# Patient Record
Sex: Female | Born: 1950 | Race: White | Hispanic: No | Marital: Married | Smoking: Never smoker
Health system: Southern US, Community
[De-identification: ages and names within clinical notes are randomized; demographics above are authoritative.]

## PROBLEM LIST (undated history)

## (undated) DIAGNOSIS — E119 Type 2 diabetes mellitus without complications: Secondary | ICD-10-CM

## (undated) DIAGNOSIS — C801 Malignant (primary) neoplasm, unspecified: Secondary | ICD-10-CM

## (undated) DIAGNOSIS — K219 Gastro-esophageal reflux disease without esophagitis: Secondary | ICD-10-CM

## (undated) DIAGNOSIS — I10 Essential (primary) hypertension: Secondary | ICD-10-CM

## (undated) DIAGNOSIS — R569 Unspecified convulsions: Secondary | ICD-10-CM

## (undated) DIAGNOSIS — Z8719 Personal history of other diseases of the digestive system: Secondary | ICD-10-CM

## (undated) HISTORY — PX: APPENDECTOMY: SHX54

## (undated) HISTORY — PX: LAPAROSCOPIC BILATERAL SALPINGO OOPHERECTOMY: SHX5890

## (undated) HISTORY — PX: BREAST BIOPSY: SHX20

---

## 2002-06-19 ENCOUNTER — Ambulatory Visit (HOSPITAL_COMMUNITY): Admission: RE | Admit: 2002-06-19 | Discharge: 2002-06-19 | Payer: Self-pay | Admitting: Internal Medicine

## 2003-08-06 ENCOUNTER — Ambulatory Visit (HOSPITAL_COMMUNITY): Admission: RE | Admit: 2003-08-06 | Discharge: 2003-08-06 | Payer: Self-pay | Admitting: Internal Medicine

## 2003-08-18 DIAGNOSIS — C801 Malignant (primary) neoplasm, unspecified: Secondary | ICD-10-CM

## 2003-08-18 HISTORY — DX: Malignant (primary) neoplasm, unspecified: C80.1

## 2003-08-18 HISTORY — PX: PARTIAL COLECTOMY: SHX5273

## 2004-12-03 ENCOUNTER — Ambulatory Visit (HOSPITAL_COMMUNITY): Admission: RE | Admit: 2004-12-03 | Discharge: 2004-12-03 | Payer: Self-pay | Admitting: Internal Medicine

## 2004-12-03 ENCOUNTER — Ambulatory Visit: Payer: Self-pay | Admitting: Internal Medicine

## 2010-01-02 ENCOUNTER — Other Ambulatory Visit: Admission: RE | Admit: 2010-01-02 | Discharge: 2010-01-02 | Payer: Self-pay | Admitting: Obstetrics and Gynecology

## 2010-07-16 ENCOUNTER — Ambulatory Visit: Payer: Self-pay | Admitting: Internal Medicine

## 2011-01-02 NOTE — H&P (Signed)
Sheri [name], Sheri [name]                         ACCOUNT NO.:  [id number]   MEDICAL RECORD NO.:  [id number]                  PATIENT TYPE:   LOCATION:                                       FACILITY:   PHYSICIAN:  Lionel December, M.D.                 DATE OF BIRTH:  10/22/1950   DATE OF ADMISSION:  06/07/2002  DATE OF DISCHARGE:                                HISTORY & PHYSICAL   CHIEF COMPLAINT:  1. Constant cough.  2. Hiatal hernia.   HISTORY OF PRESENT ILLNESS:  The patient is a pleasant 60 year old lady, a  patient of Fara Chute, M.D., who presents today as a self-referral for  further evaluation of constant chronic cough and a history of hiatal hernia.  She says that she has had chronic gastrointestinal reflux disease for more  than 10 years.  This has also been associated with typical reflux symptoms,  as well as intermittent coughing.  She also has a history of allergies.  She  has been seen by an allergist in the past, who felt that her symptoms were  secondary to a combination of the acid reflux and allergies.  She has been  on allergy shots for over 10 years, but has not been on any for the last  five years.  She is maintained on Zyrtec.  She tells me that in the past she  has been on Propulsid, which has been the only thing that helped with her  cough.  Currently she is on Nexium 40 mg b.i.d.  She has no heartburn  symptoms, but continues to have chronic intermittent coughing.  She  describes this as a dry, hacking-type cough with a tickle in the back of her  throat.  The symptoms generally are worse when she is lying down at night or  even in the early morning hours.  She really denies any dysphagia, except  for if she eats cornbread.  Denies any odynophagia, nausea, vomiting,  abdominal pain, constipation, diarrhea, melena, or rectal bleeding.  She  never had a colonoscopy.  She never had an upper endoscopy.  She did have an  upper GI series about five years ago, which  revealed the hiatal hernia.  She  was recently started on Zocor for her hypercholesterolemia.  She notes that  she has slightly elevated transaminases, which Fara Chute, M.D., is  following closely.   CURRENT MEDICATIONS:  1. Nexium 40 mg b.i.d.  2. Cozaar 50 mg q.d.  3. Zyrtec 10 mg q.d.  4. Zocor 10 mg q.d.   ALLERGIES:  PHENOBARBITAL caused seizures.  DONNATAL causes hives.   PAST MEDICAL HISTORY:  1. Noninsulin-dependent diabetes mellitus, controlled by diet.  2. Hypertension.  3. Hypercholesterolemia.  4. Seasonal allergies.  5. Recent urinary tract infection, status post treatment.   PAST SURGICAL HISTORY:  Left breast cyst removal, which was benign.   FAMILY HISTORY:  No family history of  chronic GI illnesses, colorectal  cancer, or liver disease.   SOCIAL HISTORY:  She has been married for 33 years.  She has one daughter.  She is currently a housewife.  She has never been a smoker.  Denies any  alcohol use.   REVIEW OF SYMPTOMS:  GASTROINTESTINAL:  Please see the HPI.  GENERAL:  Denies any weight loss.  CARDIOPULMONARY:  Denies any shortness of breath or  chest pain.  Complains of chronic intermittent coughing.  GENITOURINARY:  Denies any dysuria, urinary frequency, or hematuria.   PHYSICAL EXAMINATION:  WEIGHT:  316 pounds.  HEIGHT:  5 feet 1 inch.  VITAL SIGNS:  Temperature 98.2 degrees, blood pressure 142/80, pulse 72.  GENERAL APPEARANCE:  A very pleasant, well-nourished, well-developed,  moderately obese, Caucasian female in no acute distress.  SKIN:  Warm and dry.  No jaundice.  HEENT:  Pupils equal, round, and reactive to light.  The conjunctivae are  pink.  Sclerae nonicteric.  Oropharyngeal mucosa moist and pink with no  lesions, erythema, or exudate.  NECK:  No lymphadenopathy, thyromegaly, or carotid bruits.  CHEST:  Lungs clear to auscultation.  CARDIAC:  Regular rate and rhythm.  Normal S1 and S2.  No murmur, rub, or  gallop.  ABDOMEN:  Positive  bowel sounds.  Obese, but symmetrical.  Soft.  Nontender.  No organomegaly or masses.  EXTREMITIES:  No edema.   IMPRESSION:  The patient is a pleasant 60 year old lady who has more than a  10-year history of chronic gastrointestinal reflux disease.  Typical reflux  symptoms are well controlled on Nexium 40 mg b.i.d.  She also has a chronic  intermittent cough, which has been felt previously to be secondary to a  combination of her allergies and acid reflux.  Notably, she has had complete  resolution of her cough previously on Propulsid.  I suspect that she  probably does have a chronic cough due to her acid reflux based on these  findings.  We talked today about upper endoscopy for surveillance purposes,  as well as diagnostic purposes.  We need to make sure that she does not have  complicated GERD, i.e. Barrett's esophagus.  She has never had a colonoscopy  and I have recommended that she have one in the near future.  She will let  me know when she is ready.   She is noted to have mild transaminitis with an SGOT of 43 and an SGPT of 56  on blood work in July of 2003.  She was placed on Zocor prior to this blood  work.  She reports that Fara Chute, M.D., is following this closely.   PLAN:  1. EGD in the near future.  2. She will continue Nexium 40 mg b.i.d. for now.  3. After EGD, could consider a trial of either domperidone or even Zelnorm     for promobility-type effect.  4.     Further recommendations to follow.  5. She is to continuing following up with Fara Chute, M.D., regarding mild     elevated transaminases.  6. She will let us know when she is ready to schedule colonoscopy.     Tana Coast, P.A.                        Lionel December, M.D.    LL/MEDQ  D:  06/07/2002  T:  06/07/2002  Job:  [id number]   cc:   Fara Chute  [address] [facility]  [place]  [place] [address]  Fax: [phone]

## 2011-01-02 NOTE — Op Note (Signed)
[name], SOBH                       ACCOUNT NO.:  [id number]   MEDICAL RECORD NO.:  [id number]                   PATIENT TYPE:  AMB   LOCATION:  DAY                                  FACILITY:  [facility]   PHYSICIAN:  Sheri [name], M.D.                 DATE OF BIRTH:  Jul 14, 1951   DATE OF PROCEDURE:  08/06/2003  DATE OF DISCHARGE:                                 OPERATIVE REPORT   PROCEDURE:  Total colonoscopy.   INDICATIONS FOR PROCEDURE:  Sheri [name] is a 60 year old Caucasian female who  has been followed by [place] for atypical symptoms of GERD.  She was doing great  on domperidone until it was not available any more.  She was tried on  Zelnorm but developed diarrhea and had some hematochezia.  We did  Hemoccults, and three out of three are position.  She is therefore  undergoing diagnostic colonoscopy.  Family history is negative for  colorectal carcinoma.  The procedure and risks were reviewed with the  patient, and informed consent was obtained.   PREOPERATIVE MEDICATIONS:  Demerol 50 mg IV, Versed 5 mg IV in divided dose.   FINDINGS:  The procedure was performed in the endoscopy suite.  The  patient's vital signs and O2 saturations were monitored during the procedure  and remained stable.  The patient was placed in the left lateral recumbent  position and rectal examination performed.  Soft sentinel skin tags were  noted externally, but digital exam was normal.  The scope was placed into  the rectum and advanced into the region of the sigmoid colon and beyond.  The preparation was excellent.  The scope was passed to the cecum which was  identified by the appendiceal orifice and ileocecal valve.  As the scope was  withdrawn, the colonic mucosa was carefully examined.  There was a small  polyp at the descending colon which was ablated by cold biopsy.  There was a  flat lesion with depressed ulcerated center at rectosigmoid junction about 3  cm in diameter.  Endoscopic appearance  was suspicious for carcinoma.  Pictures were taken followed by multiple biopsies.  The rectal mucosa was  normal.  The scope was retroflexed to examine the anorectal junction, and  small hemorrhoids were noted below the dentate line.  The endoscope was  straightened and withdrawn.  The patient tolerated the procedure well.   FINAL DIAGNOSES:  1. Examination performed to the cecum.  2. Flat ulcerated lesion at rectosigmoid junction about 3 cm in diameter.     Endoscopic appearance is suspicious for carcinoma.  Biopsies were taken.  3. Small polyp ablated by cold biopsy from descending colon.  4. Small external hemorrhoids.   RECOMMENDATIONS:  Will arrange for a surgical consult with Dr. Cleotis Nipper as  soon as biopsy results are available.  I did review the endoscopic findings  over the phone with Dr. Fara Chute who is the patient's  primary care  physician.      ___________________________________________                                            Sheri [name], M.D.   NR/MEDQ  D:  08/06/2003  T:  08/06/2003  Job:  [id number]   cc:   Fara Chute  [address] [place]  [place] [address]  Fax: [phone]   Kathaleen Maser. Cleotis Nipper, M.D.  [address]  [place]  [place] [address]  Fax: [phone]

## 2011-01-02 NOTE — Op Note (Signed)
NAMEMALVA, DIESING             ACCOUNT NO.:  [id number]   MEDICAL RECORD NO.:  [id number]          PATIENT TYPE:  AMB   LOCATION:  DAY                           FACILITY:  [facility]   PHYSICIAN:  Lionel December, M.D.    DATE OF BIRTH:  27-Jul-1951   DATE OF PROCEDURE:  12/03/2004  DATE OF DISCHARGE:                                 OPERATIVE REPORT   PROCEDURE:  Colonoscopy.   INDICATIONS:  Sheri [name] is a 60 year old Caucasian female who is here for  surveillance colonoscopy. 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 She did not require adjuvant therapy. She presently is free  of any symptoms. Family history is negative for colorectal carcinoma.  Procedure risks were reviewed with the patient, and informed consent was  obtained.   PREMEDICATION:  Demerol 50 mg IV Versed 4 mg IV.   FINDINGS:  Procedure performed in endoscopy suite. The patient's vital signs  and O2 saturation were monitored during the procedure and remained stable.  The patient was placed in left lateral position and rectal examination  performed. No abnormality noted on external or digital exam. Olympus  videoscope was placed in rectum and advanced proximally. Colorectal  anastomosis was noted at 15 cm from the anal margin and appeared to be  unremarkable. Scope was passed to proximal sigmoid colon and beyond.  Preparation was excellent. Scope was easily advanced to cecum which was  identified by ileocecal valve and appendiceal orifice. She had some stool  and bile in this area. However, examination was felt to be satisfactory. As  the scope was withdrawn, colonic mucosa was examined for the second time,  and there were no polyps and/or tumor masses. Rectal mucosa similarly was  normal. Scope was retroflexed to examine anorectal junction and was  unremarkable. Endoscope was straightened and withdrawn. The patient  tolerated the procedure well.   FINAL DIAGNOSIS:  1.  Normal colonoscopy.  2.   Patent colorectal anastomosis at 15 cm from the anal margin.   RECOMMENDATIONS:  1.  She will continue yearly Hemoccults.  2.  CEA will be checked today as she has not had one recently.  3.  She should consider next exam in three years from now.      NR/MEDQ  D:  12/03/2004  T:  12/03/2004  Job:  [id number]   cc:   Fara Chute  [address] [place]  [place] [address]  Fax: [phone]   Kathaleen Maser. Cleotis Nipper, M.D.  [address]  [place]  [place] [address]  Fax: [phone]

## 2011-01-02 NOTE — Op Note (Signed)
Sheri [name], Sheri [name]                         ACCOUNT NO.:  [id number]   MEDICAL RECORD NO.:  [id number]                  PATIENT TYPE:   LOCATION:                                       FACILITY:   PHYSICIAN:  Lionel December, M.D.                 DATE OF BIRTH:   DATE OF PROCEDURE:  06/19/2002  DATE OF DISCHARGE:                                 OPERATIVE REPORT   PROCEDURE:  Esophagogastroduodenoscopy.   ENDOSCOPIST:  Lionel December, M.D.   INDICATIONS:  This patient is a 60 year old Caucasian female with a several  year history of GERD who is on antireflux measures, Nexium 40 mg b.i.d.  While her heartburn is reasonably well controlled with therapy, she still  has this recurrent dry hacking cough.  She is undergoing diagnostic EGD.  The procedure and risks were reviewed with the patient and informed consent  was obtained.   PREOPERATIVE MEDICATIONS:  Cetacaine spray for pharyngeal topical  anesthesia, Demerol 50 mg IV and Versed 6 mg IV in divided dose.   INSTRUMENT:  Olympus video system.   FINDINGS:  Procedure performed in endoscopy suite.  The patient's vital  signs and O2 saturations were monitored during the procedure and remained  stable.  The patient was placed in the left lateral recumbent position and  endoscope was passed via the oropharynx without any difficulty into the  esophagus.   ESOPHAGUS:  Mucosa of the esophagus was normal throughout.  The  squamocolumnar junction was unremarkable.  The GE junction was patulous and  remained open.   STOMACH:  It was empty and distended very well with insufflation.  The folds  of the proximal stomach were normal.  Examination of the mucosa revealed  linear streaks at the antrum and prepyloric mucosal edema.  The pyloric  channel, however, was patent.  The angularis and fundus were examined by  retroflexing the scope and were normal.   DUODENUM:  Examination of the bulb and second part of the duodenum was  normal.   The endoscope was withdrawn.  The patient tolerated the procedure well.   FINAL DIAGNOSES:  1. Normal examination of the esophagus.  2. Antral gastritis.   RECOMMENDATIONS:  1. She will continue antireflux measures.  2. Will drop her Nexium to 40 mg q.a.m. and start her on Reglan 10 mg before     breakfast, evening meal and at     bedtime.  3. She will also have Helicobacter pylori serology drawn today.  4. Unless she has side effects with Reglan she will return for office visit     1 month from now.                                               Lionel December, M.D.  NR/MEDQ  D:  06/19/2002  T:  06/19/2002  Job:  [id number]   cc:   Fara Chute  [address] [place]  [place] [address]  Fax: [phone]

## 2011-01-14 ENCOUNTER — Ambulatory Visit (HOSPITAL_COMMUNITY)
Admission: RE | Admit: 2011-01-14 | Discharge: 2011-01-14 | Disposition: A | Discharge status: Home / Self Care | Payer: 59 | Source: Ambulatory Visit | Attending: Internal Medicine | Admitting: Internal Medicine

## 2011-01-14 ENCOUNTER — Other Ambulatory Visit (INDEPENDENT_AMBULATORY_CARE_PROVIDER_SITE_OTHER): Payer: Self-pay | Admitting: Internal Medicine

## 2011-01-14 ENCOUNTER — Encounter (HOSPITAL_BASED_OUTPATIENT_CLINIC_OR_DEPARTMENT_OTHER): Payer: 59 | Admitting: Internal Medicine

## 2011-01-14 DIAGNOSIS — E785 Hyperlipidemia, unspecified: Secondary | ICD-10-CM | POA: Insufficient documentation

## 2011-01-14 DIAGNOSIS — Z85038 Personal history of other malignant neoplasm of large intestine: Secondary | ICD-10-CM

## 2011-01-14 DIAGNOSIS — E119 Type 2 diabetes mellitus without complications: Secondary | ICD-10-CM | POA: Insufficient documentation

## 2011-01-14 DIAGNOSIS — Z79899 Other long term (current) drug therapy: Secondary | ICD-10-CM | POA: Insufficient documentation

## 2011-01-14 DIAGNOSIS — I10 Essential (primary) hypertension: Secondary | ICD-10-CM | POA: Insufficient documentation

## 2011-01-14 DIAGNOSIS — D126 Benign neoplasm of colon, unspecified: Secondary | ICD-10-CM | POA: Insufficient documentation

## 2011-01-14 DIAGNOSIS — K644 Residual hemorrhoidal skin tags: Secondary | ICD-10-CM | POA: Insufficient documentation

## 2011-01-14 DIAGNOSIS — Z09 Encounter for follow-up examination after completed treatment for conditions other than malignant neoplasm: Secondary | ICD-10-CM

## 2011-01-14 DIAGNOSIS — Z9049 Acquired absence of other specified parts of digestive tract: Secondary | ICD-10-CM | POA: Insufficient documentation

## 2011-01-14 LAB — GLUCOSE, CAPILLARY: Glucose-Capillary: 191 mg/dL — ABNORMAL HIGH (ref 70–99)

## 2011-02-03 NOTE — Op Note (Signed)
  NAMECAMREN, [name]             ACCOUNT NO.:  [id number]  MEDICAL RECORD NO.:  [id number]           PATIENT TYPE:  O  LOCATION:  DAYP                          FACILITY:  [facility]  PHYSICIAN:  Lionel December, M.D.    DATE OF BIRTH:  23-Aug-1950  DATE OF PROCEDURE: DATE OF DISCHARGE:                              OPERATIVE REPORT   PROCEDURE:  Colonoscopy.  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  She did not require any adjuvant therapy.  Her last colonoscopy was in May 2009. She is doing well and here for surveillance colonoscopy.  Procedures were reviewed with the patient.  Informed consent was obtained.  MEDS FOR CONSCIOUS SEDATION: 1. Demerol 50 mg IV. 2. Versed 6 mg IV.  FINDINGS:  Procedure performed in endoscopy suite.  The patient's vital signs and O2 sat were monitored during the procedure and remained stable.  The patient was placed in left lateral recumbent position. Rectal examination performed.  No abnormality noted on external or digital exam.  Pentax videoscope was placed through rectum and advanced under vision into sigmoid colon and beyond.  Preparation was excellent. Scope was passed into cecum which was identified by appendiceal orifice and ileocecal valve.  Pictures were taken for the record.  As the scope was withdrawn, colonic mucosa was carefully examined.  There was a small polyp at midtransverse colon heading behind the fold.  This was ablated via cold biopsy.  There was another 3-4 mm polyp at splenic flexure which was ablated via cold biopsy and submitted in separate container. Mucosa and rest of the colon was normal.  Colorectal anastomosis located at 14-15 cm from the anal margin was wide open.  Rectal mucosa was normal.  Scope was retroflexed to examine anorectal junction and small hemorrhoids noted below the dentate line.  Endoscope was withdrawn. Withdrawal  time was 9 minutes and 45 seconds.  The patient tolerated the procedure well.  FINAL DIAGNOSES: 1. Examination performed to cecum. 2. Two small polyps ablated via cold biopsy; one from transverse     colon, second one from the splenic flexure. 3. Patent colorectal anastomosis located at 14-15 cm from the anal     margin. 4. Small external hemorrhoids.  RECOMMENDATIONS:  Standard instructions given.  I will be contacting patient with results of biopsy and further recommendations.          ______________________________ Lionel December, M.D.     NR/MEDQ  D:  01/14/2011  T:  01/14/2011  Job:  [id number]  cc:   Fara Chute, MD Fax: [phone]  Electronically Signed by Lionel December M.D. on 02/03/2011 09:59:48 AM

## 2011-04-27 ENCOUNTER — Telehealth (INDEPENDENT_AMBULATORY_CARE_PROVIDER_SITE_OTHER): Payer: Self-pay | Admitting: Internal Medicine

## 2011-04-27 MED ORDER — RABEPRAZOLE SODIUM 20 MG PO TBEC: 20.0000 mg | DELAYED_RELEASE_TABLET | Freq: Every day | ORAL | Status: DC

## 2011-04-27 NOTE — Telephone Encounter (Signed)
Sheri [name] would like to try another PPI.  The Dexilant has a $90.00 co-pay. Will try her on Aciphex.  Samples for 1 month.   Sheri [name]

## 2014-01-17 DIAGNOSIS — I10 Essential (primary) hypertension: Secondary | ICD-10-CM | POA: Diagnosis not present

## 2014-01-17 DIAGNOSIS — N189 Chronic kidney disease, unspecified: Secondary | ICD-10-CM | POA: Diagnosis not present

## 2014-01-17 DIAGNOSIS — E7800 Pure hypercholesterolemia, unspecified: Secondary | ICD-10-CM | POA: Diagnosis not present

## 2014-01-17 DIAGNOSIS — E119 Type 2 diabetes mellitus without complications: Secondary | ICD-10-CM | POA: Diagnosis not present

## 2014-01-18 ENCOUNTER — Encounter (INDEPENDENT_AMBULATORY_CARE_PROVIDER_SITE_OTHER): Payer: Self-pay | Admitting: *Deleted

## 2014-01-24 DIAGNOSIS — N189 Chronic kidney disease, unspecified: Secondary | ICD-10-CM | POA: Diagnosis not present

## 2014-01-24 DIAGNOSIS — IMO0001 Reserved for inherently not codable concepts without codable children: Secondary | ICD-10-CM | POA: Diagnosis not present

## 2014-01-24 DIAGNOSIS — E669 Obesity, unspecified: Secondary | ICD-10-CM | POA: Diagnosis not present

## 2014-01-24 DIAGNOSIS — I10 Essential (primary) hypertension: Secondary | ICD-10-CM | POA: Diagnosis not present

## 2014-01-24 DIAGNOSIS — E1129 Type 2 diabetes mellitus with other diabetic kidney complication: Secondary | ICD-10-CM | POA: Diagnosis not present

## 2014-01-24 DIAGNOSIS — E7800 Pure hypercholesterolemia, unspecified: Secondary | ICD-10-CM | POA: Diagnosis not present

## 2014-01-24 DIAGNOSIS — C189 Malignant neoplasm of colon, unspecified: Secondary | ICD-10-CM | POA: Diagnosis not present

## 2014-03-16 ENCOUNTER — Telehealth (INDEPENDENT_AMBULATORY_CARE_PROVIDER_SITE_OTHER): Payer: Self-pay | Admitting: *Deleted

## 2014-03-16 ENCOUNTER — Other Ambulatory Visit (INDEPENDENT_AMBULATORY_CARE_PROVIDER_SITE_OTHER): Payer: Self-pay | Admitting: *Deleted

## 2014-03-16 DIAGNOSIS — Z1211 Encounter for screening for malignant neoplasm of colon: Secondary | ICD-10-CM

## 2014-03-16 DIAGNOSIS — Z86010 Personal history of colonic polyps: Secondary | ICD-10-CM

## 2014-03-16 NOTE — Telephone Encounter (Signed)
Patient needs trilyte 

## 2014-03-21 MED ORDER — PEG 3350-KCL-NA BICARB-NACL 420 G PO SOLR: 4000.0000 mL | Freq: Once | ORAL | Status: DC

## 2014-04-10 ENCOUNTER — Encounter (INDEPENDENT_AMBULATORY_CARE_PROVIDER_SITE_OTHER): Payer: Self-pay | Admitting: *Deleted

## 2014-04-11 DIAGNOSIS — K2100 Gastro-esophageal reflux disease with esophagitis, without bleeding: Secondary | ICD-10-CM | POA: Diagnosis not present

## 2014-04-13 ENCOUNTER — Telehealth (INDEPENDENT_AMBULATORY_CARE_PROVIDER_SITE_OTHER): Payer: Self-pay | Admitting: *Deleted

## 2014-04-13 NOTE — Telephone Encounter (Signed)
agree

## 2014-04-13 NOTE — Telephone Encounter (Signed)
  Procedure: tcs  Reason/Indication:  Hx polyps  Has patient had this procedure before?  Yes, 2012 -- paper chart  If so, when, by whom and where?    Is there a family history of colon cancer?  no  Who?  What age when diagnosed?    Is patient diabetic?   yes      Does patient have prosthetic heart valve?  no  Do you have a pacemaker?  no  Has patient ever had endocarditis? no  Has patient had joint replacement within last 12 months?  no  Does patient tend to be constipated or take laxatives? occassionally  Is patient on Coumadin, Plavix and/or Aspirin? yes  Medications: asa 81 mg daily, metformin 1000 mg bid, glipizide 2.5 mg daily, losartan/hctz 50/12.5 mg daily, omeprazole 20 mg daily, simvastatin 10 mg daily, calcium, centrum silver  Allergies: phenobarbital  Medication Adjustment: asa 2 days, hold metformin evening before and morning of, hold glipizide morning of  Procedure date & time: 05/10/14 at 1200

## 2014-04-25 ENCOUNTER — Encounter (HOSPITAL_COMMUNITY): Payer: Self-pay | Admitting: Pharmacy Technician

## 2014-04-25 DIAGNOSIS — Z85828 Personal history of other malignant neoplasm of skin: Secondary | ICD-10-CM | POA: Diagnosis not present

## 2014-04-25 DIAGNOSIS — D2330 Other benign neoplasm of skin of unspecified part of face: Secondary | ICD-10-CM | POA: Diagnosis not present

## 2014-04-25 DIAGNOSIS — D485 Neoplasm of uncertain behavior of skin: Secondary | ICD-10-CM | POA: Diagnosis not present

## 2014-04-25 DIAGNOSIS — L570 Actinic keratosis: Secondary | ICD-10-CM | POA: Diagnosis not present

## 2014-05-11 ENCOUNTER — Ambulatory Visit (HOSPITAL_COMMUNITY): Admission: RE | Admit: 2014-05-11 | Payer: Medicare Other | Source: Ambulatory Visit | Admitting: Internal Medicine

## 2014-05-11 ENCOUNTER — Encounter (HOSPITAL_COMMUNITY): Admission: RE | Payer: Self-pay | Source: Ambulatory Visit

## 2014-05-11 SURGERY — COLONOSCOPY
Anesthesia: Moderate Sedation

## 2014-05-17 DIAGNOSIS — E1165 Type 2 diabetes mellitus with hyperglycemia: Secondary | ICD-10-CM | POA: Diagnosis not present

## 2014-05-17 DIAGNOSIS — E780 Pure hypercholesterolemia: Secondary | ICD-10-CM | POA: Diagnosis not present

## 2014-05-17 DIAGNOSIS — I10 Essential (primary) hypertension: Secondary | ICD-10-CM | POA: Diagnosis not present

## 2014-05-24 DIAGNOSIS — E1165 Type 2 diabetes mellitus with hyperglycemia: Secondary | ICD-10-CM | POA: Diagnosis not present

## 2014-05-24 DIAGNOSIS — I10 Essential (primary) hypertension: Secondary | ICD-10-CM | POA: Diagnosis not present

## 2014-05-24 DIAGNOSIS — C189 Malignant neoplasm of colon, unspecified: Secondary | ICD-10-CM | POA: Diagnosis not present

## 2014-05-24 DIAGNOSIS — Z23 Encounter for immunization: Secondary | ICD-10-CM | POA: Diagnosis not present

## 2014-05-24 DIAGNOSIS — E1122 Type 2 diabetes mellitus with diabetic chronic kidney disease: Secondary | ICD-10-CM | POA: Diagnosis not present

## 2014-05-24 DIAGNOSIS — E780 Pure hypercholesterolemia: Secondary | ICD-10-CM | POA: Diagnosis not present

## 2014-05-24 DIAGNOSIS — K210 Gastro-esophageal reflux disease with esophagitis: Secondary | ICD-10-CM | POA: Diagnosis not present

## 2014-05-24 DIAGNOSIS — N189 Chronic kidney disease, unspecified: Secondary | ICD-10-CM | POA: Diagnosis not present

## 2014-06-08 ENCOUNTER — Telehealth (INDEPENDENT_AMBULATORY_CARE_PROVIDER_SITE_OTHER): Payer: Self-pay | Admitting: *Deleted

## 2014-06-08 ENCOUNTER — Other Ambulatory Visit (INDEPENDENT_AMBULATORY_CARE_PROVIDER_SITE_OTHER): Payer: Self-pay | Admitting: *Deleted

## 2014-06-08 ENCOUNTER — Encounter (INDEPENDENT_AMBULATORY_CARE_PROVIDER_SITE_OTHER): Payer: Self-pay | Admitting: *Deleted

## 2014-06-08 DIAGNOSIS — Z86010 Personal history of colonic polyps: Secondary | ICD-10-CM

## 2014-06-08 NOTE — Telephone Encounter (Signed)
agree

## 2014-06-08 NOTE — Telephone Encounter (Signed)
Referring MD/PCP: sasser    Procedure: tcs  Reason/Indication:  Hx polyps  Has patient had this procedure before?  Yes, 2012 -- paper chart  If so, when, by whom and where?    Is there a family history of colon cancer?  no  Who?  What age when diagnosed?    Is patient diabetic?   yes      Does patient have prosthetic heart valve?  no  Do you have a pacemaker?  no  Has patient ever had endocarditis? no  Has patient had joint replacement within last 12 months?  no  Does patient tend to be constipated or take laxatives? no  Is patient on Coumadin, Plavix and/or Aspirin? yes  Medications: see epic  Allergies: see epic  Medication Adjustment: ass 2 days, hold metformin evening before and monring  Procedure date & time: 06/28/14 at 200

## 2014-06-15 ENCOUNTER — Encounter (HOSPITAL_COMMUNITY): Payer: Self-pay | Admitting: Pharmacy Technician

## 2014-06-25 DIAGNOSIS — L309 Dermatitis, unspecified: Secondary | ICD-10-CM | POA: Diagnosis not present

## 2014-06-28 ENCOUNTER — Encounter (HOSPITAL_COMMUNITY)
Admission: RE | Disposition: A | Discharge status: Home / Self Care | Payer: Self-pay | Source: Ambulatory Visit | Attending: Internal Medicine

## 2014-06-28 ENCOUNTER — Encounter (HOSPITAL_COMMUNITY): Payer: Self-pay | Admitting: *Deleted

## 2014-06-28 ENCOUNTER — Ambulatory Visit (HOSPITAL_COMMUNITY)
Admission: RE | Admit: 2014-06-28 | Discharge: 2014-06-28 | Disposition: A | Discharge status: Home / Self Care | Payer: Medicare Other | Source: Ambulatory Visit | Attending: Internal Medicine | Admitting: Internal Medicine

## 2014-06-28 DIAGNOSIS — Z7982 Long term (current) use of aspirin: Secondary | ICD-10-CM | POA: Diagnosis not present

## 2014-06-28 DIAGNOSIS — Z9049 Acquired absence of other specified parts of digestive tract: Secondary | ICD-10-CM | POA: Insufficient documentation

## 2014-06-28 DIAGNOSIS — Z79899 Other long term (current) drug therapy: Secondary | ICD-10-CM | POA: Insufficient documentation

## 2014-06-28 DIAGNOSIS — K219 Gastro-esophageal reflux disease without esophagitis: Secondary | ICD-10-CM | POA: Insufficient documentation

## 2014-06-28 DIAGNOSIS — I10 Essential (primary) hypertension: Secondary | ICD-10-CM | POA: Insufficient documentation

## 2014-06-28 DIAGNOSIS — K644 Residual hemorrhoidal skin tags: Secondary | ICD-10-CM | POA: Diagnosis not present

## 2014-06-28 DIAGNOSIS — K649 Unspecified hemorrhoids: Secondary | ICD-10-CM | POA: Diagnosis not present

## 2014-06-28 DIAGNOSIS — Z86010 Personal history of colonic polyps: Secondary | ICD-10-CM | POA: Insufficient documentation

## 2014-06-28 DIAGNOSIS — E119 Type 2 diabetes mellitus without complications: Secondary | ICD-10-CM | POA: Diagnosis not present

## 2014-06-28 DIAGNOSIS — Z85038 Personal history of other malignant neoplasm of large intestine: Secondary | ICD-10-CM | POA: Diagnosis not present

## 2014-06-28 HISTORY — DX: Unspecified convulsions: R56.9

## 2014-06-28 HISTORY — DX: Personal history of other diseases of the digestive system: Z87.19

## 2014-06-28 HISTORY — PX: COLONOSCOPY: SHX5424

## 2014-06-28 HISTORY — DX: Type 2 diabetes mellitus without complications: E11.9

## 2014-06-28 HISTORY — DX: Essential (primary) hypertension: I10

## 2014-06-28 HISTORY — DX: Malignant (primary) neoplasm, unspecified: C80.1

## 2014-06-28 HISTORY — DX: Gastro-esophageal reflux disease without esophagitis: K21.9

## 2014-06-28 LAB — GLUCOSE, CAPILLARY: GLUCOSE-CAPILLARY: 157 mg/dL — AB (ref 70–99)

## 2014-06-28 SURGERY — COLONOSCOPY
Anesthesia: Moderate Sedation

## 2014-06-28 MED ORDER — MEPERIDINE HCL 50 MG/ML IJ SOLN
INTRAMUSCULAR | Status: AC
  Filled 2014-06-28: qty 1

## 2014-06-28 MED ORDER — MIDAZOLAM HCL 5 MG/5ML IJ SOLN
INTRAMUSCULAR | Status: DC | PRN
  Administered 2014-06-28 (×4): 2 mg via INTRAVENOUS

## 2014-06-28 MED ORDER — SODIUM CHLORIDE 0.9 % IV SOLN
INTRAVENOUS | Status: DC
Start: 2014-06-28 — End: 2014-06-28
  Administered 2014-06-28: 09:00:00 via INTRAVENOUS

## 2014-06-28 MED ORDER — MEPERIDINE HCL 50 MG/ML IJ SOLN
INTRAMUSCULAR | Status: DC | PRN
  Administered 2014-06-28 (×2): 25 mg via INTRAVENOUS

## 2014-06-28 MED ORDER — MIDAZOLAM HCL 5 MG/5ML IJ SOLN
INTRAMUSCULAR | Status: AC
  Filled 2014-06-28: qty 10

## 2014-06-28 NOTE — Discharge Instructions (Signed)
Resume usual medications and diet. No driving for 24 hours. Next colonoscopy in 4 years.   Colonoscopy, Care After These instructions give you information on caring for yourself after your procedure. Your doctor may also give you more specific instructions. Call your doctor if you have any problems or questions after your procedure. HOME CARE  Do not drive for 24 hours.  Do not sign important papers or use machinery for 24 hours.  You may shower.  You may go back to your usual activities, but go slower for the first 24 hours.  Take rest breaks often during the first 24 hours.  Walk around or use warm packs on your belly (abdomen) if you have belly cramping or gas.  Drink enough fluids to keep your pee (urine) clear or pale yellow.  Resume your normal diet. Avoid heavy or fried foods.  Avoid drinking alcohol for 24 hours or as told by your doctor.  Only take medicines as told by your doctor. If a tissue sample (biopsy) was taken during the procedure:   Do not take aspirin or blood thinners for 7 days, or as told by your doctor.  Do not drink alcohol for 7 days, or as told by your doctor.  Eat soft foods for the first 24 hours. GET HELP IF: You still have a small amount of blood in your poop (stool) 2-3 days after the procedure. GET HELP RIGHT AWAY IF:  You have more than a small amount of blood in your poop.  You see clumps of tissue (blood clots) in your poop.  Your belly is puffy (swollen).  You feel sick to your stomach (nauseous) or throw up (vomit).  You have a fever.  You have belly pain that gets worse and medicine does not help. MAKE SURE YOU:  Understand these instructions.  Will watch your condition.  Will get help right away if you are not doing well or get worse. Document Released: 09/05/2010 Document Revised: 08/08/2013 Document Reviewed: 04/10/2013 [facility] Patient Information 2015 [place], [place]. This information is not intended to replace  advice given to you by your health care provider. Make sure you discuss any questions you have with your health care provider.

## 2014-06-28 NOTE — Op Note (Signed)
COLONOSCOPY PROCEDURE REPORT  PATIENT:  Sheri [name]  MR#:  [id number] Birthdate:  1951/04/03, 63 y.o., female Endoscopist:  Dr. Rogene Houston, MD Referred By:  Dr. Manon Hilding, MD Procedure Date: 06/28/2014  Procedure:   Colonoscopy  Indications:  Patient is 63 year old Caucasian female with history of colon carcinoma. She status post low anterior resection in 2005. She also has history of colonic adenomas. Last exam was in May 2012 with removal of 2 tubular adenomas.  Informed Consent:  The procedure and risks were reviewed with the patient and informed consent was obtained.  Medications:  Demerol 50 mg IV Versed 8 mg IV  Description of procedure:  After a digital rectal exam was performed, that colonoscope was advanced from the anus through the rectum and colon to the area of the cecum, ileocecal valve and appendiceal orifice. The cecum was deeply intubated. These structures were well-seen and photographed for the record. From the level of the cecum and ileocecal valve, the scope was slowly and cautiously withdrawn. The mucosal surfaces were carefully surveyed utilizing scope tip to flexion to facilitate fold flattening as needed. The scope was pulled down into the rectum where a thorough exam including retroflexion was performed.  Findings:  . Prep satisfactory. Normal mucosa of cecum, ascending colon, hepatic flexure, transverse colon, splenic flexure, descending and sigmoid colon. Wide-open colorectal anastomosis at 15 cm from the anal margin. Small hemorrhoids below the dentate line and single small anal papilla.   Therapeutic/Diagnostic Maneuvers Performed:   None  Complications:  none  Cecal Withdrawal Time:  9 minutes  Impression:  Examination performed to cecum. Wide-open colorectal anastomosis. Small external hemorrhoids and single anal papilla. No evidence of recurrent polyps.  Recommendations:  Standard instructions given. Next colonoscopy in 5  years.  Terek Bee U  06/28/2014 10:39 AM  CC: Dr. Manon Hilding, MD & Dr. Rayne Du ref. provider found

## 2014-06-28 NOTE — H&P (Signed)
Sheri [name] is an 63 y.o. female.   Chief Complaint:  Patient is here for colonoscopy. HPI:  Patient is 63 year old Caucasian female with history of colon carcinoma status post low anterior resection back in 2005 and also has history of colonic adenomas and is here for surveillance colonoscopy. She denies abdominal pain rectal bleeding anorexia weight loss. She had acute diarrheal illness over a month ago and has fully recovered. Family history is negative for CRC.  Past Medical History  Diagnosis Date  . Hypertension   . Diabetes mellitus without complication   . GERD (gastroesophageal reflux disease)   . History of hiatal hernia   . Seizures     As an infant  . Cancer 2005    Colon Cancer    Past Surgical History  Procedure Laterality Date  . Laparoscopic bilateral salpingo oopherectomy Bilateral   . Appendectomy    . Breast biopsy    . Partial colectomy  2005    Family History  Problem Relation Age of Onset  . Heart attack Father    Social History:  reports that she has never smoked. She has never used smokeless tobacco. She reports that she does not drink alcohol or use illicit drugs.  Allergies:  Allergies  Allergen Reactions  . Phenobarbital Hives    Medications Prior to Admission  Medication Sig Dispense Refill  . aspirin EC 81 MG tablet Take 81 mg by mouth daily.    . Calcium Carb-Cholecalciferol (CALCIUM 500 +D PO) Take 1 tablet by mouth daily.    Marland Kitchen glipiZIDE (GLUCOTROL XL) 2.5 MG 24 hr tablet Take 2.5 mg by mouth daily with breakfast.    . losartan-hydrochlorothiazide (HYZAAR) 50-12.5 MG per tablet Take 1 tablet by mouth daily.    . metFORMIN (GLUCOPHAGE) 1000 MG tablet Take 1,000 mg by mouth 2 (two) times daily with a meal.    . Multiple Vitamins-Minerals (MULTIVITAMINS THER. W/MINERALS) TABS tablet Take 1 tablet by mouth daily.    . pantoprazole (PROTONIX) 40 MG tablet Take 40 mg by mouth daily.    . ranitidine (ZANTAC) 150 MG tablet Take 150 mg by  mouth at bedtime.    . simvastatin (ZOCOR) 10 MG tablet Take 10 mg by mouth at bedtime.       Results for orders placed or performed during the hospital encounter of 06/28/14 (from the past 48 hour(s))  Glucose, capillary     Status: Abnormal   Collection Time: 06/28/14  9:03 AM  Result Value Ref Range   Glucose-Capillary 157 (H) 70 - 99 mg/dL   Comment 1 Documented in Chart    No results found.  ROS  Blood pressure 190/97, pulse 107, temperature 98.5 F (36.9 C), temperature source Oral, resp. rate 18. Physical Exam  Constitutional: She appears well-developed and well-nourished.  HENT:  Mouth/Throat: Oropharynx is clear and moist.  Eyes: Conjunctivae are normal. No scleral icterus.  Neck: No thyromegaly present.  Cardiovascular: Normal rate, regular rhythm and normal heart sounds.   No murmur heard. Respiratory: Effort normal and breath sounds normal.  GI: Soft. She exhibits no distension and no mass. There is no tenderness.  Midline scar  Musculoskeletal: She exhibits no edema.  Lymphadenopathy:    She has no cervical adenopathy.  Neurological: She is alert.  Skin: Skin is warm and dry.     Assessment/Plan History of colon carcinoma and colonic adenomas. Surveillance colonoscopy.  REHMAN,NAJEEB U 06/28/2014, 10:04 AM

## 2014-06-29 ENCOUNTER — Encounter (HOSPITAL_COMMUNITY): Payer: Self-pay | Admitting: Internal Medicine

## 2014-07-03 DIAGNOSIS — L03113 Cellulitis of right upper limb: Secondary | ICD-10-CM | POA: Diagnosis not present

## 2014-07-06 DIAGNOSIS — L03113 Cellulitis of right upper limb: Secondary | ICD-10-CM | POA: Diagnosis not present

## 2014-08-29 DIAGNOSIS — B372 Candidiasis of skin and nail: Secondary | ICD-10-CM | POA: Diagnosis not present

## 2014-08-29 DIAGNOSIS — B354 Tinea corporis: Secondary | ICD-10-CM | POA: Diagnosis not present

## 2014-09-19 DIAGNOSIS — N189 Chronic kidney disease, unspecified: Secondary | ICD-10-CM | POA: Diagnosis not present

## 2014-09-19 DIAGNOSIS — K210 Gastro-esophageal reflux disease with esophagitis: Secondary | ICD-10-CM | POA: Diagnosis not present

## 2014-09-19 DIAGNOSIS — I10 Essential (primary) hypertension: Secondary | ICD-10-CM | POA: Diagnosis not present

## 2014-09-19 DIAGNOSIS — E1165 Type 2 diabetes mellitus with hyperglycemia: Secondary | ICD-10-CM | POA: Diagnosis not present

## 2014-09-19 DIAGNOSIS — E6609 Other obesity due to excess calories: Secondary | ICD-10-CM | POA: Diagnosis not present

## 2014-09-19 DIAGNOSIS — E780 Pure hypercholesterolemia: Secondary | ICD-10-CM | POA: Diagnosis not present

## 2014-09-26 DIAGNOSIS — E1122 Type 2 diabetes mellitus with diabetic chronic kidney disease: Secondary | ICD-10-CM | POA: Diagnosis not present

## 2014-09-26 DIAGNOSIS — E780 Pure hypercholesterolemia: Secondary | ICD-10-CM | POA: Diagnosis not present

## 2014-09-26 DIAGNOSIS — E6609 Other obesity due to excess calories: Secondary | ICD-10-CM | POA: Diagnosis not present

## 2014-09-26 DIAGNOSIS — Z1389 Encounter for screening for other disorder: Secondary | ICD-10-CM | POA: Diagnosis not present

## 2014-09-26 DIAGNOSIS — I10 Essential (primary) hypertension: Secondary | ICD-10-CM | POA: Diagnosis not present

## 2014-09-26 DIAGNOSIS — N189 Chronic kidney disease, unspecified: Secondary | ICD-10-CM | POA: Diagnosis not present

## 2014-09-26 DIAGNOSIS — C189 Malignant neoplasm of colon, unspecified: Secondary | ICD-10-CM | POA: Diagnosis not present

## 2014-09-26 DIAGNOSIS — E1165 Type 2 diabetes mellitus with hyperglycemia: Secondary | ICD-10-CM | POA: Diagnosis not present

## 2014-09-26 DIAGNOSIS — Z23 Encounter for immunization: Secondary | ICD-10-CM | POA: Diagnosis not present

## 2014-09-26 DIAGNOSIS — K210 Gastro-esophageal reflux disease with esophagitis: Secondary | ICD-10-CM | POA: Diagnosis not present

## 2014-10-23 DIAGNOSIS — B373 Candidiasis of vulva and vagina: Secondary | ICD-10-CM | POA: Diagnosis not present

## 2014-10-23 DIAGNOSIS — R300 Dysuria: Secondary | ICD-10-CM | POA: Diagnosis not present

## 2014-10-29 DIAGNOSIS — Z85828 Personal history of other malignant neoplasm of skin: Secondary | ICD-10-CM | POA: Diagnosis not present

## 2014-10-29 DIAGNOSIS — L570 Actinic keratosis: Secondary | ICD-10-CM | POA: Diagnosis not present

## 2015-01-25 DIAGNOSIS — N189 Chronic kidney disease, unspecified: Secondary | ICD-10-CM | POA: Diagnosis not present

## 2015-01-25 DIAGNOSIS — E780 Pure hypercholesterolemia: Secondary | ICD-10-CM | POA: Diagnosis not present

## 2015-01-25 DIAGNOSIS — I10 Essential (primary) hypertension: Secondary | ICD-10-CM | POA: Diagnosis not present

## 2015-01-25 DIAGNOSIS — K210 Gastro-esophageal reflux disease with esophagitis: Secondary | ICD-10-CM | POA: Diagnosis not present

## 2015-01-25 DIAGNOSIS — E1122 Type 2 diabetes mellitus with diabetic chronic kidney disease: Secondary | ICD-10-CM | POA: Diagnosis not present

## 2015-02-01 DIAGNOSIS — N189 Chronic kidney disease, unspecified: Secondary | ICD-10-CM | POA: Diagnosis not present

## 2015-02-01 DIAGNOSIS — E1122 Type 2 diabetes mellitus with diabetic chronic kidney disease: Secondary | ICD-10-CM | POA: Diagnosis not present

## 2015-02-01 DIAGNOSIS — K210 Gastro-esophageal reflux disease with esophagitis: Secondary | ICD-10-CM | POA: Diagnosis not present

## 2015-02-01 DIAGNOSIS — E782 Mixed hyperlipidemia: Secondary | ICD-10-CM | POA: Diagnosis not present

## 2015-02-01 DIAGNOSIS — E6609 Other obesity due to excess calories: Secondary | ICD-10-CM | POA: Diagnosis not present

## 2015-02-01 DIAGNOSIS — I10 Essential (primary) hypertension: Secondary | ICD-10-CM | POA: Diagnosis not present

## 2015-02-01 DIAGNOSIS — C189 Malignant neoplasm of colon, unspecified: Secondary | ICD-10-CM | POA: Diagnosis not present

## 2015-02-01 DIAGNOSIS — E1165 Type 2 diabetes mellitus with hyperglycemia: Secondary | ICD-10-CM | POA: Diagnosis not present

## 2015-02-05 DIAGNOSIS — E1165 Type 2 diabetes mellitus with hyperglycemia: Secondary | ICD-10-CM | POA: Diagnosis not present

## 2015-03-07 DIAGNOSIS — I10 Essential (primary) hypertension: Secondary | ICD-10-CM | POA: Diagnosis not present

## 2015-04-07 DIAGNOSIS — I10 Essential (primary) hypertension: Secondary | ICD-10-CM | POA: Diagnosis not present

## 2015-05-06 DIAGNOSIS — Z85828 Personal history of other malignant neoplasm of skin: Secondary | ICD-10-CM | POA: Diagnosis not present

## 2015-05-06 DIAGNOSIS — L570 Actinic keratosis: Secondary | ICD-10-CM | POA: Diagnosis not present

## 2015-05-06 DIAGNOSIS — L821 Other seborrheic keratosis: Secondary | ICD-10-CM | POA: Diagnosis not present

## 2015-05-06 DIAGNOSIS — L308 Other specified dermatitis: Secondary | ICD-10-CM | POA: Diagnosis not present

## 2015-05-06 DIAGNOSIS — D485 Neoplasm of uncertain behavior of skin: Secondary | ICD-10-CM | POA: Diagnosis not present

## 2015-05-08 DIAGNOSIS — I10 Essential (primary) hypertension: Secondary | ICD-10-CM | POA: Diagnosis not present

## 2015-05-17 DIAGNOSIS — I10 Essential (primary) hypertension: Secondary | ICD-10-CM | POA: Diagnosis not present

## 2015-05-17 DIAGNOSIS — E782 Mixed hyperlipidemia: Secondary | ICD-10-CM | POA: Diagnosis not present

## 2015-05-17 DIAGNOSIS — N189 Chronic kidney disease, unspecified: Secondary | ICD-10-CM | POA: Diagnosis not present

## 2015-05-17 DIAGNOSIS — K210 Gastro-esophageal reflux disease with esophagitis: Secondary | ICD-10-CM | POA: Diagnosis not present

## 2015-05-17 DIAGNOSIS — E1165 Type 2 diabetes mellitus with hyperglycemia: Secondary | ICD-10-CM | POA: Diagnosis not present

## 2015-05-24 DIAGNOSIS — E6609 Other obesity due to excess calories: Secondary | ICD-10-CM | POA: Diagnosis not present

## 2015-05-24 DIAGNOSIS — C189 Malignant neoplasm of colon, unspecified: Secondary | ICD-10-CM | POA: Diagnosis not present

## 2015-05-24 DIAGNOSIS — N189 Chronic kidney disease, unspecified: Secondary | ICD-10-CM | POA: Diagnosis not present

## 2015-05-24 DIAGNOSIS — K210 Gastro-esophageal reflux disease with esophagitis: Secondary | ICD-10-CM | POA: Diagnosis not present

## 2015-05-24 DIAGNOSIS — I10 Essential (primary) hypertension: Secondary | ICD-10-CM | POA: Diagnosis not present

## 2015-05-24 DIAGNOSIS — E1122 Type 2 diabetes mellitus with diabetic chronic kidney disease: Secondary | ICD-10-CM | POA: Diagnosis not present

## 2015-05-24 DIAGNOSIS — E1165 Type 2 diabetes mellitus with hyperglycemia: Secondary | ICD-10-CM | POA: Diagnosis not present

## 2015-05-24 DIAGNOSIS — E782 Mixed hyperlipidemia: Secondary | ICD-10-CM | POA: Diagnosis not present

## 2015-05-24 DIAGNOSIS — Z23 Encounter for immunization: Secondary | ICD-10-CM | POA: Diagnosis not present

## 2015-05-24 DIAGNOSIS — N183 Chronic kidney disease, stage 3 (moderate): Secondary | ICD-10-CM | POA: Diagnosis not present

## 2015-06-07 DIAGNOSIS — K219 Gastro-esophageal reflux disease without esophagitis: Secondary | ICD-10-CM | POA: Diagnosis not present

## 2015-07-08 DIAGNOSIS — E119 Type 2 diabetes mellitus without complications: Secondary | ICD-10-CM | POA: Diagnosis not present

## 2015-07-08 DIAGNOSIS — I10 Essential (primary) hypertension: Secondary | ICD-10-CM | POA: Diagnosis not present

## 2015-07-30 DIAGNOSIS — J0100 Acute maxillary sinusitis, unspecified: Secondary | ICD-10-CM | POA: Diagnosis not present

## 2015-08-07 DIAGNOSIS — I10 Essential (primary) hypertension: Secondary | ICD-10-CM | POA: Diagnosis not present

## 2015-08-07 DIAGNOSIS — S60411A Abrasion of left index finger, initial encounter: Secondary | ICD-10-CM | POA: Diagnosis not present

## 2015-08-08 DIAGNOSIS — S60411D Abrasion of left index finger, subsequent encounter: Secondary | ICD-10-CM | POA: Diagnosis not present

## 2015-09-17 DIAGNOSIS — E1165 Type 2 diabetes mellitus with hyperglycemia: Secondary | ICD-10-CM | POA: Diagnosis not present

## 2015-09-17 DIAGNOSIS — K210 Gastro-esophageal reflux disease with esophagitis: Secondary | ICD-10-CM | POA: Diagnosis not present

## 2015-09-17 DIAGNOSIS — I10 Essential (primary) hypertension: Secondary | ICD-10-CM | POA: Diagnosis not present

## 2015-09-17 DIAGNOSIS — N183 Chronic kidney disease, stage 3 (moderate): Secondary | ICD-10-CM | POA: Diagnosis not present

## 2015-09-17 DIAGNOSIS — E782 Mixed hyperlipidemia: Secondary | ICD-10-CM | POA: Diagnosis not present

## 2015-09-24 DIAGNOSIS — E1165 Type 2 diabetes mellitus with hyperglycemia: Secondary | ICD-10-CM | POA: Diagnosis not present

## 2015-09-24 DIAGNOSIS — C189 Malignant neoplasm of colon, unspecified: Secondary | ICD-10-CM | POA: Diagnosis not present

## 2015-09-24 DIAGNOSIS — N183 Chronic kidney disease, stage 3 (moderate): Secondary | ICD-10-CM | POA: Diagnosis not present

## 2015-09-24 DIAGNOSIS — E1122 Type 2 diabetes mellitus with diabetic chronic kidney disease: Secondary | ICD-10-CM | POA: Diagnosis not present

## 2015-09-24 DIAGNOSIS — N189 Chronic kidney disease, unspecified: Secondary | ICD-10-CM | POA: Diagnosis not present

## 2015-09-24 DIAGNOSIS — E782 Mixed hyperlipidemia: Secondary | ICD-10-CM | POA: Diagnosis not present

## 2015-09-24 DIAGNOSIS — I10 Essential (primary) hypertension: Secondary | ICD-10-CM | POA: Diagnosis not present

## 2015-09-24 DIAGNOSIS — K210 Gastro-esophageal reflux disease with esophagitis: Secondary | ICD-10-CM | POA: Diagnosis not present

## 2015-09-24 DIAGNOSIS — E6609 Other obesity due to excess calories: Secondary | ICD-10-CM | POA: Diagnosis not present

## 2015-11-04 DIAGNOSIS — L570 Actinic keratosis: Secondary | ICD-10-CM | POA: Diagnosis not present

## 2015-11-04 DIAGNOSIS — L300 Nummular dermatitis: Secondary | ICD-10-CM | POA: Diagnosis not present

## 2015-11-04 DIAGNOSIS — Z85828 Personal history of other malignant neoplasm of skin: Secondary | ICD-10-CM | POA: Diagnosis not present

## 2016-01-29 DIAGNOSIS — E782 Mixed hyperlipidemia: Secondary | ICD-10-CM | POA: Diagnosis not present

## 2016-01-29 DIAGNOSIS — E1122 Type 2 diabetes mellitus with diabetic chronic kidney disease: Secondary | ICD-10-CM | POA: Diagnosis not present

## 2016-01-29 DIAGNOSIS — I10 Essential (primary) hypertension: Secondary | ICD-10-CM | POA: Diagnosis not present

## 2016-01-29 DIAGNOSIS — E7800 Pure hypercholesterolemia, unspecified: Secondary | ICD-10-CM | POA: Diagnosis not present

## 2016-01-29 DIAGNOSIS — N183 Chronic kidney disease, stage 3 (moderate): Secondary | ICD-10-CM | POA: Diagnosis not present

## 2016-01-29 DIAGNOSIS — K210 Gastro-esophageal reflux disease with esophagitis: Secondary | ICD-10-CM | POA: Diagnosis not present

## 2016-01-31 DIAGNOSIS — K210 Gastro-esophageal reflux disease with esophagitis: Secondary | ICD-10-CM | POA: Diagnosis not present

## 2016-01-31 DIAGNOSIS — E782 Mixed hyperlipidemia: Secondary | ICD-10-CM | POA: Diagnosis not present

## 2016-01-31 DIAGNOSIS — E1122 Type 2 diabetes mellitus with diabetic chronic kidney disease: Secondary | ICD-10-CM | POA: Diagnosis not present

## 2016-01-31 DIAGNOSIS — E1165 Type 2 diabetes mellitus with hyperglycemia: Secondary | ICD-10-CM | POA: Diagnosis not present

## 2016-01-31 DIAGNOSIS — N189 Chronic kidney disease, unspecified: Secondary | ICD-10-CM | POA: Diagnosis not present

## 2016-01-31 DIAGNOSIS — C189 Malignant neoplasm of colon, unspecified: Secondary | ICD-10-CM | POA: Diagnosis not present

## 2016-01-31 DIAGNOSIS — I10 Essential (primary) hypertension: Secondary | ICD-10-CM | POA: Diagnosis not present

## 2016-01-31 DIAGNOSIS — E6609 Other obesity due to excess calories: Secondary | ICD-10-CM | POA: Diagnosis not present

## 2016-04-09 DIAGNOSIS — I10 Essential (primary) hypertension: Secondary | ICD-10-CM | POA: Diagnosis not present

## 2016-04-09 DIAGNOSIS — B372 Candidiasis of skin and nail: Secondary | ICD-10-CM | POA: Diagnosis not present

## 2016-04-09 DIAGNOSIS — E1165 Type 2 diabetes mellitus with hyperglycemia: Secondary | ICD-10-CM | POA: Diagnosis not present

## 2016-04-09 DIAGNOSIS — Z6835 Body mass index (BMI) 35.0-35.9, adult: Secondary | ICD-10-CM | POA: Diagnosis not present

## 2016-05-05 DIAGNOSIS — Z85828 Personal history of other malignant neoplasm of skin: Secondary | ICD-10-CM | POA: Diagnosis not present

## 2016-05-05 DIAGNOSIS — L309 Dermatitis, unspecified: Secondary | ICD-10-CM | POA: Diagnosis not present

## 2016-05-13 DIAGNOSIS — E119 Type 2 diabetes mellitus without complications: Secondary | ICD-10-CM | POA: Diagnosis not present

## 2016-05-13 DIAGNOSIS — H524 Presbyopia: Secondary | ICD-10-CM | POA: Diagnosis not present

## 2016-05-13 DIAGNOSIS — H2513 Age-related nuclear cataract, bilateral: Secondary | ICD-10-CM | POA: Diagnosis not present

## 2016-05-21 DIAGNOSIS — E1122 Type 2 diabetes mellitus with diabetic chronic kidney disease: Secondary | ICD-10-CM | POA: Diagnosis not present

## 2016-05-21 DIAGNOSIS — I10 Essential (primary) hypertension: Secondary | ICD-10-CM | POA: Diagnosis not present

## 2016-05-21 DIAGNOSIS — E7800 Pure hypercholesterolemia, unspecified: Secondary | ICD-10-CM | POA: Diagnosis not present

## 2016-05-21 DIAGNOSIS — E782 Mixed hyperlipidemia: Secondary | ICD-10-CM | POA: Diagnosis not present

## 2016-05-21 DIAGNOSIS — K210 Gastro-esophageal reflux disease with esophagitis: Secondary | ICD-10-CM | POA: Diagnosis not present

## 2016-05-21 DIAGNOSIS — Z13220 Encounter for screening for lipoid disorders: Secondary | ICD-10-CM | POA: Diagnosis not present

## 2016-05-25 DIAGNOSIS — E1122 Type 2 diabetes mellitus with diabetic chronic kidney disease: Secondary | ICD-10-CM | POA: Diagnosis not present

## 2016-05-25 DIAGNOSIS — K210 Gastro-esophageal reflux disease with esophagitis: Secondary | ICD-10-CM | POA: Diagnosis not present

## 2016-05-25 DIAGNOSIS — Z23 Encounter for immunization: Secondary | ICD-10-CM | POA: Diagnosis not present

## 2016-05-25 DIAGNOSIS — Z6836 Body mass index (BMI) 36.0-36.9, adult: Secondary | ICD-10-CM | POA: Diagnosis not present

## 2016-05-25 DIAGNOSIS — C189 Malignant neoplasm of colon, unspecified: Secondary | ICD-10-CM | POA: Diagnosis not present

## 2016-05-25 DIAGNOSIS — N189 Chronic kidney disease, unspecified: Secondary | ICD-10-CM | POA: Diagnosis not present

## 2016-05-25 DIAGNOSIS — E1165 Type 2 diabetes mellitus with hyperglycemia: Secondary | ICD-10-CM | POA: Diagnosis not present

## 2016-05-25 DIAGNOSIS — I10 Essential (primary) hypertension: Secondary | ICD-10-CM | POA: Diagnosis not present

## 2016-08-07 DIAGNOSIS — B372 Candidiasis of skin and nail: Secondary | ICD-10-CM | POA: Diagnosis not present

## 2016-08-07 DIAGNOSIS — Z6835 Body mass index (BMI) 35.0-35.9, adult: Secondary | ICD-10-CM | POA: Diagnosis not present

## 2016-08-24 DIAGNOSIS — L304 Erythema intertrigo: Secondary | ICD-10-CM | POA: Diagnosis not present

## 2016-09-21 DIAGNOSIS — L570 Actinic keratosis: Secondary | ICD-10-CM | POA: Diagnosis not present

## 2016-09-21 DIAGNOSIS — L820 Inflamed seborrheic keratosis: Secondary | ICD-10-CM | POA: Diagnosis not present

## 2016-09-21 DIAGNOSIS — D2239 Melanocytic nevi of other parts of face: Secondary | ICD-10-CM | POA: Diagnosis not present

## 2016-09-21 DIAGNOSIS — L538 Other specified erythematous conditions: Secondary | ICD-10-CM | POA: Diagnosis not present

## 2016-09-21 DIAGNOSIS — L304 Erythema intertrigo: Secondary | ICD-10-CM | POA: Diagnosis not present

## 2016-09-21 DIAGNOSIS — L300 Nummular dermatitis: Secondary | ICD-10-CM | POA: Diagnosis not present

## 2016-09-24 DIAGNOSIS — E1165 Type 2 diabetes mellitus with hyperglycemia: Secondary | ICD-10-CM | POA: Diagnosis not present

## 2016-09-24 DIAGNOSIS — E782 Mixed hyperlipidemia: Secondary | ICD-10-CM | POA: Diagnosis not present

## 2016-09-24 DIAGNOSIS — E7800 Pure hypercholesterolemia, unspecified: Secondary | ICD-10-CM | POA: Diagnosis not present

## 2016-09-24 DIAGNOSIS — I10 Essential (primary) hypertension: Secondary | ICD-10-CM | POA: Diagnosis not present

## 2016-09-24 DIAGNOSIS — E1122 Type 2 diabetes mellitus with diabetic chronic kidney disease: Secondary | ICD-10-CM | POA: Diagnosis not present

## 2016-09-29 DIAGNOSIS — E782 Mixed hyperlipidemia: Secondary | ICD-10-CM | POA: Diagnosis not present

## 2016-09-29 DIAGNOSIS — E1122 Type 2 diabetes mellitus with diabetic chronic kidney disease: Secondary | ICD-10-CM | POA: Diagnosis not present

## 2016-09-29 DIAGNOSIS — I10 Essential (primary) hypertension: Secondary | ICD-10-CM | POA: Diagnosis not present

## 2016-09-29 DIAGNOSIS — N189 Chronic kidney disease, unspecified: Secondary | ICD-10-CM | POA: Diagnosis not present

## 2016-09-29 DIAGNOSIS — E1165 Type 2 diabetes mellitus with hyperglycemia: Secondary | ICD-10-CM | POA: Diagnosis not present

## 2016-09-29 DIAGNOSIS — K210 Gastro-esophageal reflux disease with esophagitis: Secondary | ICD-10-CM | POA: Diagnosis not present

## 2016-09-29 DIAGNOSIS — C189 Malignant neoplasm of colon, unspecified: Secondary | ICD-10-CM | POA: Diagnosis not present

## 2016-09-29 DIAGNOSIS — E6609 Other obesity due to excess calories: Secondary | ICD-10-CM | POA: Diagnosis not present

## 2016-12-21 DIAGNOSIS — D2239 Melanocytic nevi of other parts of face: Secondary | ICD-10-CM | POA: Diagnosis not present

## 2016-12-21 DIAGNOSIS — Z1283 Encounter for screening for malignant neoplasm of skin: Secondary | ICD-10-CM | POA: Diagnosis not present

## 2016-12-21 DIAGNOSIS — L738 Other specified follicular disorders: Secondary | ICD-10-CM | POA: Diagnosis not present

## 2016-12-21 DIAGNOSIS — L570 Actinic keratosis: Secondary | ICD-10-CM | POA: Diagnosis not present

## 2016-12-21 DIAGNOSIS — Z08 Encounter for follow-up examination after completed treatment for malignant neoplasm: Secondary | ICD-10-CM | POA: Diagnosis not present

## 2016-12-21 DIAGNOSIS — L304 Erythema intertrigo: Secondary | ICD-10-CM | POA: Diagnosis not present

## 2016-12-21 DIAGNOSIS — L821 Other seborrheic keratosis: Secondary | ICD-10-CM | POA: Diagnosis not present

## 2016-12-21 DIAGNOSIS — Z85828 Personal history of other malignant neoplasm of skin: Secondary | ICD-10-CM | POA: Diagnosis not present

## 2016-12-21 DIAGNOSIS — D225 Melanocytic nevi of trunk: Secondary | ICD-10-CM | POA: Diagnosis not present

## 2016-12-21 DIAGNOSIS — L300 Nummular dermatitis: Secondary | ICD-10-CM | POA: Diagnosis not present

## 2017-01-15 DIAGNOSIS — E782 Mixed hyperlipidemia: Secondary | ICD-10-CM | POA: Diagnosis not present

## 2017-01-15 DIAGNOSIS — I10 Essential (primary) hypertension: Secondary | ICD-10-CM | POA: Diagnosis not present

## 2017-01-15 DIAGNOSIS — N183 Chronic kidney disease, stage 3 (moderate): Secondary | ICD-10-CM | POA: Diagnosis not present

## 2017-01-15 DIAGNOSIS — E1165 Type 2 diabetes mellitus with hyperglycemia: Secondary | ICD-10-CM | POA: Diagnosis not present

## 2017-01-15 DIAGNOSIS — E7800 Pure hypercholesterolemia, unspecified: Secondary | ICD-10-CM | POA: Diagnosis not present

## 2017-01-15 DIAGNOSIS — K210 Gastro-esophageal reflux disease with esophagitis: Secondary | ICD-10-CM | POA: Diagnosis not present

## 2017-01-15 DIAGNOSIS — C189 Malignant neoplasm of colon, unspecified: Secondary | ICD-10-CM | POA: Diagnosis not present

## 2017-01-15 DIAGNOSIS — E1122 Type 2 diabetes mellitus with diabetic chronic kidney disease: Secondary | ICD-10-CM | POA: Diagnosis not present

## 2017-01-19 DIAGNOSIS — E1165 Type 2 diabetes mellitus with hyperglycemia: Secondary | ICD-10-CM | POA: Diagnosis not present

## 2017-01-19 DIAGNOSIS — C189 Malignant neoplasm of colon, unspecified: Secondary | ICD-10-CM | POA: Diagnosis not present

## 2017-01-19 DIAGNOSIS — K210 Gastro-esophageal reflux disease with esophagitis: Secondary | ICD-10-CM | POA: Diagnosis not present

## 2017-01-19 DIAGNOSIS — E6609 Other obesity due to excess calories: Secondary | ICD-10-CM | POA: Diagnosis not present

## 2017-01-19 DIAGNOSIS — E1122 Type 2 diabetes mellitus with diabetic chronic kidney disease: Secondary | ICD-10-CM | POA: Diagnosis not present

## 2017-01-19 DIAGNOSIS — S161XXA Strain of muscle, fascia and tendon at neck level, initial encounter: Secondary | ICD-10-CM | POA: Diagnosis not present

## 2017-01-19 DIAGNOSIS — N189 Chronic kidney disease, unspecified: Secondary | ICD-10-CM | POA: Diagnosis not present

## 2017-01-19 DIAGNOSIS — I10 Essential (primary) hypertension: Secondary | ICD-10-CM | POA: Diagnosis not present

## 2017-01-21 DIAGNOSIS — M531 Cervicobrachial syndrome: Secondary | ICD-10-CM | POA: Diagnosis not present

## 2017-01-21 DIAGNOSIS — M47812 Spondylosis without myelopathy or radiculopathy, cervical region: Secondary | ICD-10-CM | POA: Diagnosis not present

## 2017-01-21 DIAGNOSIS — M9901 Segmental and somatic dysfunction of cervical region: Secondary | ICD-10-CM | POA: Diagnosis not present

## 2017-01-22 DIAGNOSIS — M9901 Segmental and somatic dysfunction of cervical region: Secondary | ICD-10-CM | POA: Diagnosis not present

## 2017-01-22 DIAGNOSIS — M47812 Spondylosis without myelopathy or radiculopathy, cervical region: Secondary | ICD-10-CM | POA: Diagnosis not present

## 2017-01-22 DIAGNOSIS — M531 Cervicobrachial syndrome: Secondary | ICD-10-CM | POA: Diagnosis not present

## 2017-05-13 DIAGNOSIS — N183 Chronic kidney disease, stage 3 (moderate): Secondary | ICD-10-CM | POA: Diagnosis not present

## 2017-05-13 DIAGNOSIS — E782 Mixed hyperlipidemia: Secondary | ICD-10-CM | POA: Diagnosis not present

## 2017-05-13 DIAGNOSIS — E1165 Type 2 diabetes mellitus with hyperglycemia: Secondary | ICD-10-CM | POA: Diagnosis not present

## 2017-05-13 DIAGNOSIS — E1122 Type 2 diabetes mellitus with diabetic chronic kidney disease: Secondary | ICD-10-CM | POA: Diagnosis not present

## 2017-05-13 DIAGNOSIS — E7800 Pure hypercholesterolemia, unspecified: Secondary | ICD-10-CM | POA: Diagnosis not present

## 2017-05-13 DIAGNOSIS — K210 Gastro-esophageal reflux disease with esophagitis: Secondary | ICD-10-CM | POA: Diagnosis not present

## 2017-05-13 DIAGNOSIS — I10 Essential (primary) hypertension: Secondary | ICD-10-CM | POA: Diagnosis not present

## 2017-05-18 DIAGNOSIS — C189 Malignant neoplasm of colon, unspecified: Secondary | ICD-10-CM | POA: Diagnosis not present

## 2017-05-18 DIAGNOSIS — E1165 Type 2 diabetes mellitus with hyperglycemia: Secondary | ICD-10-CM | POA: Diagnosis not present

## 2017-05-18 DIAGNOSIS — Z23 Encounter for immunization: Secondary | ICD-10-CM | POA: Diagnosis not present

## 2017-05-18 DIAGNOSIS — Z6834 Body mass index (BMI) 34.0-34.9, adult: Secondary | ICD-10-CM | POA: Diagnosis not present

## 2017-05-18 DIAGNOSIS — I10 Essential (primary) hypertension: Secondary | ICD-10-CM | POA: Diagnosis not present

## 2017-05-18 DIAGNOSIS — K210 Gastro-esophageal reflux disease with esophagitis: Secondary | ICD-10-CM | POA: Diagnosis not present

## 2017-05-18 DIAGNOSIS — E1122 Type 2 diabetes mellitus with diabetic chronic kidney disease: Secondary | ICD-10-CM | POA: Diagnosis not present

## 2017-05-18 DIAGNOSIS — N189 Chronic kidney disease, unspecified: Secondary | ICD-10-CM | POA: Diagnosis not present

## 2017-05-19 DIAGNOSIS — E119 Type 2 diabetes mellitus without complications: Secondary | ICD-10-CM | POA: Diagnosis not present

## 2017-05-19 DIAGNOSIS — H2513 Age-related nuclear cataract, bilateral: Secondary | ICD-10-CM | POA: Diagnosis not present

## 2017-06-01 DIAGNOSIS — Z6834 Body mass index (BMI) 34.0-34.9, adult: Secondary | ICD-10-CM | POA: Diagnosis not present

## 2017-06-01 DIAGNOSIS — J0101 Acute recurrent maxillary sinusitis: Secondary | ICD-10-CM | POA: Diagnosis not present

## 2017-09-20 DIAGNOSIS — E1122 Type 2 diabetes mellitus with diabetic chronic kidney disease: Secondary | ICD-10-CM | POA: Diagnosis not present

## 2017-09-20 DIAGNOSIS — E7800 Pure hypercholesterolemia, unspecified: Secondary | ICD-10-CM | POA: Diagnosis not present

## 2017-09-20 DIAGNOSIS — N183 Chronic kidney disease, stage 3 (moderate): Secondary | ICD-10-CM | POA: Diagnosis not present

## 2017-09-20 DIAGNOSIS — C189 Malignant neoplasm of colon, unspecified: Secondary | ICD-10-CM | POA: Diagnosis not present

## 2017-09-20 DIAGNOSIS — I10 Essential (primary) hypertension: Secondary | ICD-10-CM | POA: Diagnosis not present

## 2017-09-20 DIAGNOSIS — E782 Mixed hyperlipidemia: Secondary | ICD-10-CM | POA: Diagnosis not present

## 2017-09-20 DIAGNOSIS — K210 Gastro-esophageal reflux disease with esophagitis: Secondary | ICD-10-CM | POA: Diagnosis not present

## 2017-09-23 DIAGNOSIS — N189 Chronic kidney disease, unspecified: Secondary | ICD-10-CM | POA: Diagnosis not present

## 2017-09-23 DIAGNOSIS — M722 Plantar fascial fibromatosis: Secondary | ICD-10-CM | POA: Diagnosis not present

## 2017-09-23 DIAGNOSIS — C189 Malignant neoplasm of colon, unspecified: Secondary | ICD-10-CM | POA: Diagnosis not present

## 2017-09-23 DIAGNOSIS — E1165 Type 2 diabetes mellitus with hyperglycemia: Secondary | ICD-10-CM | POA: Diagnosis not present

## 2017-09-23 DIAGNOSIS — I10 Essential (primary) hypertension: Secondary | ICD-10-CM | POA: Diagnosis not present

## 2017-09-23 DIAGNOSIS — E1122 Type 2 diabetes mellitus with diabetic chronic kidney disease: Secondary | ICD-10-CM | POA: Diagnosis not present

## 2017-09-23 DIAGNOSIS — N183 Chronic kidney disease, stage 3 (moderate): Secondary | ICD-10-CM | POA: Diagnosis not present

## 2017-09-23 DIAGNOSIS — E782 Mixed hyperlipidemia: Secondary | ICD-10-CM | POA: Diagnosis not present

## 2017-12-21 DIAGNOSIS — Z85828 Personal history of other malignant neoplasm of skin: Secondary | ICD-10-CM | POA: Diagnosis not present

## 2017-12-21 DIAGNOSIS — L821 Other seborrheic keratosis: Secondary | ICD-10-CM | POA: Diagnosis not present

## 2017-12-21 DIAGNOSIS — D2262 Melanocytic nevi of left upper limb, including shoulder: Secondary | ICD-10-CM | POA: Diagnosis not present

## 2017-12-21 DIAGNOSIS — L304 Erythema intertrigo: Secondary | ICD-10-CM | POA: Diagnosis not present

## 2017-12-21 DIAGNOSIS — D2239 Melanocytic nevi of other parts of face: Secondary | ICD-10-CM | POA: Diagnosis not present

## 2017-12-21 DIAGNOSIS — Z08 Encounter for follow-up examination after completed treatment for malignant neoplasm: Secondary | ICD-10-CM | POA: Diagnosis not present

## 2017-12-21 DIAGNOSIS — L570 Actinic keratosis: Secondary | ICD-10-CM | POA: Diagnosis not present

## 2017-12-21 DIAGNOSIS — Z1283 Encounter for screening for malignant neoplasm of skin: Secondary | ICD-10-CM | POA: Diagnosis not present

## 2017-12-21 DIAGNOSIS — D2261 Melanocytic nevi of right upper limb, including shoulder: Secondary | ICD-10-CM | POA: Diagnosis not present

## 2017-12-30 DIAGNOSIS — M47816 Spondylosis without myelopathy or radiculopathy, lumbar region: Secondary | ICD-10-CM | POA: Diagnosis not present

## 2017-12-30 DIAGNOSIS — M5441 Lumbago with sciatica, right side: Secondary | ICD-10-CM | POA: Diagnosis not present

## 2017-12-30 DIAGNOSIS — M9903 Segmental and somatic dysfunction of lumbar region: Secondary | ICD-10-CM | POA: Diagnosis not present

## 2018-01-03 DIAGNOSIS — M9903 Segmental and somatic dysfunction of lumbar region: Secondary | ICD-10-CM | POA: Diagnosis not present

## 2018-01-03 DIAGNOSIS — M5441 Lumbago with sciatica, right side: Secondary | ICD-10-CM | POA: Diagnosis not present

## 2018-01-03 DIAGNOSIS — M47816 Spondylosis without myelopathy or radiculopathy, lumbar region: Secondary | ICD-10-CM | POA: Diagnosis not present

## 2018-01-05 DIAGNOSIS — M47816 Spondylosis without myelopathy or radiculopathy, lumbar region: Secondary | ICD-10-CM | POA: Diagnosis not present

## 2018-01-05 DIAGNOSIS — M5441 Lumbago with sciatica, right side: Secondary | ICD-10-CM | POA: Diagnosis not present

## 2018-01-05 DIAGNOSIS — M9903 Segmental and somatic dysfunction of lumbar region: Secondary | ICD-10-CM | POA: Diagnosis not present

## 2018-01-07 DIAGNOSIS — M5441 Lumbago with sciatica, right side: Secondary | ICD-10-CM | POA: Diagnosis not present

## 2018-01-07 DIAGNOSIS — M9903 Segmental and somatic dysfunction of lumbar region: Secondary | ICD-10-CM | POA: Diagnosis not present

## 2018-01-07 DIAGNOSIS — M47816 Spondylosis without myelopathy or radiculopathy, lumbar region: Secondary | ICD-10-CM | POA: Diagnosis not present

## 2018-01-12 DIAGNOSIS — M5441 Lumbago with sciatica, right side: Secondary | ICD-10-CM | POA: Diagnosis not present

## 2018-01-12 DIAGNOSIS — M47816 Spondylosis without myelopathy or radiculopathy, lumbar region: Secondary | ICD-10-CM | POA: Diagnosis not present

## 2018-01-12 DIAGNOSIS — M9903 Segmental and somatic dysfunction of lumbar region: Secondary | ICD-10-CM | POA: Diagnosis not present

## 2018-01-14 DIAGNOSIS — M5441 Lumbago with sciatica, right side: Secondary | ICD-10-CM | POA: Diagnosis not present

## 2018-01-14 DIAGNOSIS — M47816 Spondylosis without myelopathy or radiculopathy, lumbar region: Secondary | ICD-10-CM | POA: Diagnosis not present

## 2018-01-14 DIAGNOSIS — M9903 Segmental and somatic dysfunction of lumbar region: Secondary | ICD-10-CM | POA: Diagnosis not present

## 2018-01-17 DIAGNOSIS — K909 Intestinal malabsorption, unspecified: Secondary | ICD-10-CM | POA: Diagnosis not present

## 2018-01-17 DIAGNOSIS — K580 Irritable bowel syndrome with diarrhea: Secondary | ICD-10-CM | POA: Diagnosis not present

## 2018-01-17 DIAGNOSIS — Z6835 Body mass index (BMI) 35.0-35.9, adult: Secondary | ICD-10-CM | POA: Diagnosis not present

## 2018-01-19 DIAGNOSIS — M5441 Lumbago with sciatica, right side: Secondary | ICD-10-CM | POA: Diagnosis not present

## 2018-01-19 DIAGNOSIS — M9903 Segmental and somatic dysfunction of lumbar region: Secondary | ICD-10-CM | POA: Diagnosis not present

## 2018-01-19 DIAGNOSIS — M47816 Spondylosis without myelopathy or radiculopathy, lumbar region: Secondary | ICD-10-CM | POA: Diagnosis not present

## 2018-01-21 DIAGNOSIS — C189 Malignant neoplasm of colon, unspecified: Secondary | ICD-10-CM | POA: Diagnosis not present

## 2018-01-21 DIAGNOSIS — E1122 Type 2 diabetes mellitus with diabetic chronic kidney disease: Secondary | ICD-10-CM | POA: Diagnosis not present

## 2018-01-21 DIAGNOSIS — N183 Chronic kidney disease, stage 3 (moderate): Secondary | ICD-10-CM | POA: Diagnosis not present

## 2018-01-21 DIAGNOSIS — I10 Essential (primary) hypertension: Secondary | ICD-10-CM | POA: Diagnosis not present

## 2018-01-21 DIAGNOSIS — E1165 Type 2 diabetes mellitus with hyperglycemia: Secondary | ICD-10-CM | POA: Diagnosis not present

## 2018-01-21 DIAGNOSIS — E782 Mixed hyperlipidemia: Secondary | ICD-10-CM | POA: Diagnosis not present

## 2018-01-21 DIAGNOSIS — K210 Gastro-esophageal reflux disease with esophagitis: Secondary | ICD-10-CM | POA: Diagnosis not present

## 2018-01-21 DIAGNOSIS — E7800 Pure hypercholesterolemia, unspecified: Secondary | ICD-10-CM | POA: Diagnosis not present

## 2018-01-31 DIAGNOSIS — Z6834 Body mass index (BMI) 34.0-34.9, adult: Secondary | ICD-10-CM | POA: Diagnosis not present

## 2018-01-31 DIAGNOSIS — I10 Essential (primary) hypertension: Secondary | ICD-10-CM | POA: Diagnosis not present

## 2018-01-31 DIAGNOSIS — E782 Mixed hyperlipidemia: Secondary | ICD-10-CM | POA: Diagnosis not present

## 2018-01-31 DIAGNOSIS — N183 Chronic kidney disease, stage 3 (moderate): Secondary | ICD-10-CM | POA: Diagnosis not present

## 2018-01-31 DIAGNOSIS — E1165 Type 2 diabetes mellitus with hyperglycemia: Secondary | ICD-10-CM | POA: Diagnosis not present

## 2018-01-31 DIAGNOSIS — E1122 Type 2 diabetes mellitus with diabetic chronic kidney disease: Secondary | ICD-10-CM | POA: Diagnosis not present

## 2018-01-31 DIAGNOSIS — N189 Chronic kidney disease, unspecified: Secondary | ICD-10-CM | POA: Diagnosis not present

## 2018-01-31 DIAGNOSIS — C189 Malignant neoplasm of colon, unspecified: Secondary | ICD-10-CM | POA: Diagnosis not present

## 2018-02-01 DIAGNOSIS — M47816 Spondylosis without myelopathy or radiculopathy, lumbar region: Secondary | ICD-10-CM | POA: Diagnosis not present

## 2018-02-01 DIAGNOSIS — M5441 Lumbago with sciatica, right side: Secondary | ICD-10-CM | POA: Diagnosis not present

## 2018-02-01 DIAGNOSIS — M9903 Segmental and somatic dysfunction of lumbar region: Secondary | ICD-10-CM | POA: Diagnosis not present

## 2018-02-15 DIAGNOSIS — M47816 Spondylosis without myelopathy or radiculopathy, lumbar region: Secondary | ICD-10-CM | POA: Diagnosis not present

## 2018-02-15 DIAGNOSIS — M9903 Segmental and somatic dysfunction of lumbar region: Secondary | ICD-10-CM | POA: Diagnosis not present

## 2018-02-15 DIAGNOSIS — M5441 Lumbago with sciatica, right side: Secondary | ICD-10-CM | POA: Diagnosis not present

## 2018-03-16 ENCOUNTER — Encounter (INDEPENDENT_AMBULATORY_CARE_PROVIDER_SITE_OTHER): Payer: Self-pay | Admitting: Internal Medicine

## 2018-03-16 ENCOUNTER — Ambulatory Visit (INDEPENDENT_AMBULATORY_CARE_PROVIDER_SITE_OTHER): Payer: Medicare Other | Admitting: Internal Medicine

## 2018-03-16 VITALS — BP 118/70 | HR 72 | Resp 18 | Ht 61.5 in | Wt 187.7 lb

## 2018-03-16 DIAGNOSIS — C189 Malignant neoplasm of colon, unspecified: Secondary | ICD-10-CM | POA: Diagnosis not present

## 2018-03-16 DIAGNOSIS — R195 Other fecal abnormalities: Secondary | ICD-10-CM | POA: Diagnosis not present

## 2018-03-16 LAB — CBC
HCT: 39 % (ref 35.0–45.0)
Hemoglobin: 12.8 g/dL (ref 11.7–15.5)
MCH: 27.5 pg (ref 27.0–33.0)
MCHC: 32.8 g/dL (ref 32.0–36.0)
MCV: 83.9 fL (ref 80.0–100.0)
MPV: 10.5 fL (ref 7.5–12.5)
PLATELETS: 300 10*3/uL (ref 140–400)
RBC: 4.65 10*6/uL (ref 3.80–5.10)
RDW: 14.1 % (ref 11.0–15.0)
WBC: 6.1 10*3/uL (ref 3.8–10.8)

## 2018-03-16 NOTE — Progress Notes (Signed)
Subjective:    Patient ID: Sheri [name], female    DOB: August 15, 1951, 67 y.o.   MRN: [id number]  HPI  Presents today with c/o diarrhea. She tells me she had something like a virus and had diarrhea. She thinks she had a fever but low grade. Virus was several months ago.  Has tried fiber for the diarrhea. She saw Dr. Quintin Alto and was given Dicyclomine 10mg  TID. She says she doesn't have diarrhea everyday. No abdominal pain. No recent antibiotics. She did not have a BM yesterday.  Sometimes she will have a normal BM and then she will have diarrhea.  Concerned because she has a hx of colon cancer. Status post low anterior resection in 2005. Also has hx of colonic adenomas. Her last colonoscopy was in 2015 Her appetite is good.  No weight loss.   Review of Systems Past Medical History:  Diagnosis Date  . Cancer [facility]) 2005   Colon Cancer  . Diabetes mellitus without complication ([place])   . GERD (gastroesophageal reflux disease)   . History of hiatal hernia   . Hypertension   . Seizures ([employer])    As an infant    Past Surgical History:  Procedure Laterality Date  . APPENDECTOMY    . BREAST BIOPSY    . COLONOSCOPY N/A 06/28/2014   Procedure: COLONOSCOPY;  Surgeon: Rogene Houston, MD;  Location: [facility];  Service: Endoscopy;  Laterality: N/A;  200 - moved to 9:30 - Ann to notify pt  . LAPAROSCOPIC BILATERAL SALPINGO OOPHERECTOMY Bilateral   . PARTIAL COLECTOMY  2005    Allergies  Allergen Reactions  . Phenobarbital Hives    Current Outpatient Medications on File Prior to Visit  Medication Sig Dispense Refill  . aspirin EC 81 MG tablet Take 81 mg by mouth daily.    . Calcium Carb-Cholecalciferol (CALCIUM 500 +D PO) Take 1 tablet by mouth daily.    Marland Kitchen dicyclomine (BENTYL) 10 MG capsule Take 10 mg by mouth 3 (three) times daily before meals.    Marland Kitchen glipiZIDE (GLUCOTROL XL) 2.5 MG 24 hr tablet Take 2.5 mg by mouth daily with breakfast.    . losartan-hydrochlorothiazide (HYZAAR)  50-12.5 MG per tablet Take 1 tablet by mouth daily.    . metFORMIN (GLUCOPHAGE) 1000 MG tablet Take 1,000 mg by mouth 2 (two) times daily with a meal.    . Multiple Vitamins-Minerals (MULTIVITAMINS THER. W/MINERALS) TABS tablet Take 1 tablet by mouth daily.    . pantoprazole (PROTONIX) 40 MG tablet Take 40 mg by mouth daily.    . ranitidine (ZANTAC) 150 MG tablet Take 150 mg by mouth at bedtime.    . simvastatin (ZOCOR) 10 MG tablet Take 10 mg by mouth at bedtime.      No current facility-administered medications on file prior to visit.         Objective:   Physical Exam Blood pressure 118/70, pulse 72, resp. rate 18, height 5' 1.5" (1.562 m), weight 187 lb 11.2 oz (85.1 kg). Alert and oriented. Skin warm and dry. Oral mucosa is moist.   . Sclera anicteric, conjunctivae is pink. Thyroid not enlarged. No cervical lymphadenopathy. Lungs clear. Heart regular rate and rhythm.  Abdomen is soft. Bowel sounds are positive. No hepatomegaly. No abdominal masses felt. No tenderness.  No edema to lower extremities.   Stool brown and guaiac negative.        Assessment & Plan:  Change in stool. She will continue the Metamucil. Stop the Dicyclomine.  OV in 6 weeks. 

## 2018-03-16 NOTE — Patient Instructions (Signed)
OV in 6 weeks. 

## 2018-03-18 ENCOUNTER — Other Ambulatory Visit: Payer: Self-pay

## 2018-04-27 ENCOUNTER — Ambulatory Visit (INDEPENDENT_AMBULATORY_CARE_PROVIDER_SITE_OTHER): Payer: Medicare Other | Admitting: Internal Medicine

## 2018-05-27 DIAGNOSIS — E782 Mixed hyperlipidemia: Secondary | ICD-10-CM | POA: Diagnosis not present

## 2018-05-27 DIAGNOSIS — Z23 Encounter for immunization: Secondary | ICD-10-CM | POA: Diagnosis not present

## 2018-05-27 DIAGNOSIS — I10 Essential (primary) hypertension: Secondary | ICD-10-CM | POA: Diagnosis not present

## 2018-05-27 DIAGNOSIS — H524 Presbyopia: Secondary | ICD-10-CM | POA: Diagnosis not present

## 2018-05-27 DIAGNOSIS — K580 Irritable bowel syndrome with diarrhea: Secondary | ICD-10-CM | POA: Diagnosis not present

## 2018-05-27 DIAGNOSIS — E1165 Type 2 diabetes mellitus with hyperglycemia: Secondary | ICD-10-CM | POA: Diagnosis not present

## 2018-05-27 DIAGNOSIS — H2513 Age-related nuclear cataract, bilateral: Secondary | ICD-10-CM | POA: Diagnosis not present

## 2018-05-27 DIAGNOSIS — E119 Type 2 diabetes mellitus without complications: Secondary | ICD-10-CM | POA: Diagnosis not present

## 2018-05-27 DIAGNOSIS — H40013 Open angle with borderline findings, low risk, bilateral: Secondary | ICD-10-CM | POA: Diagnosis not present

## 2018-05-27 DIAGNOSIS — N183 Chronic kidney disease, stage 3 (moderate): Secondary | ICD-10-CM | POA: Diagnosis not present

## 2018-05-27 DIAGNOSIS — E1122 Type 2 diabetes mellitus with diabetic chronic kidney disease: Secondary | ICD-10-CM | POA: Diagnosis not present

## 2018-05-27 DIAGNOSIS — C189 Malignant neoplasm of colon, unspecified: Secondary | ICD-10-CM | POA: Diagnosis not present

## 2018-07-04 ENCOUNTER — Other Ambulatory Visit: Payer: Self-pay

## 2018-09-20 DIAGNOSIS — I10 Essential (primary) hypertension: Secondary | ICD-10-CM | POA: Diagnosis not present

## 2018-09-20 DIAGNOSIS — E1165 Type 2 diabetes mellitus with hyperglycemia: Secondary | ICD-10-CM | POA: Diagnosis not present

## 2018-09-20 DIAGNOSIS — E7800 Pure hypercholesterolemia, unspecified: Secondary | ICD-10-CM | POA: Diagnosis not present

## 2018-09-20 DIAGNOSIS — K210 Gastro-esophageal reflux disease with esophagitis: Secondary | ICD-10-CM | POA: Diagnosis not present

## 2018-09-20 DIAGNOSIS — E1122 Type 2 diabetes mellitus with diabetic chronic kidney disease: Secondary | ICD-10-CM | POA: Diagnosis not present

## 2018-09-23 DIAGNOSIS — I10 Essential (primary) hypertension: Secondary | ICD-10-CM | POA: Diagnosis not present

## 2018-09-23 DIAGNOSIS — Z6833 Body mass index (BMI) 33.0-33.9, adult: Secondary | ICD-10-CM | POA: Diagnosis not present

## 2018-09-23 DIAGNOSIS — Z0001 Encounter for general adult medical examination with abnormal findings: Secondary | ICD-10-CM | POA: Diagnosis not present

## 2018-12-22 DIAGNOSIS — Z1283 Encounter for screening for malignant neoplasm of skin: Secondary | ICD-10-CM | POA: Diagnosis not present

## 2018-12-22 DIAGNOSIS — D2239 Melanocytic nevi of other parts of face: Secondary | ICD-10-CM | POA: Diagnosis not present

## 2018-12-22 DIAGNOSIS — Z85828 Personal history of other malignant neoplasm of skin: Secondary | ICD-10-CM | POA: Diagnosis not present

## 2018-12-22 DIAGNOSIS — L304 Erythema intertrigo: Secondary | ICD-10-CM | POA: Diagnosis not present

## 2018-12-22 DIAGNOSIS — D2262 Melanocytic nevi of left upper limb, including shoulder: Secondary | ICD-10-CM | POA: Diagnosis not present

## 2018-12-22 DIAGNOSIS — Z08 Encounter for follow-up examination after completed treatment for malignant neoplasm: Secondary | ICD-10-CM | POA: Diagnosis not present

## 2018-12-22 DIAGNOSIS — L570 Actinic keratosis: Secondary | ICD-10-CM | POA: Diagnosis not present

## 2018-12-22 DIAGNOSIS — L821 Other seborrheic keratosis: Secondary | ICD-10-CM | POA: Diagnosis not present

## 2018-12-22 DIAGNOSIS — B354 Tinea corporis: Secondary | ICD-10-CM | POA: Diagnosis not present

## 2018-12-22 DIAGNOSIS — D2261 Melanocytic nevi of right upper limb, including shoulder: Secondary | ICD-10-CM | POA: Diagnosis not present

## 2019-01-16 DIAGNOSIS — E119 Type 2 diabetes mellitus without complications: Secondary | ICD-10-CM | POA: Diagnosis not present

## 2019-01-16 DIAGNOSIS — E7800 Pure hypercholesterolemia, unspecified: Secondary | ICD-10-CM | POA: Diagnosis not present

## 2019-01-16 DIAGNOSIS — Z79899 Other long term (current) drug therapy: Secondary | ICD-10-CM | POA: Diagnosis not present

## 2019-01-17 DIAGNOSIS — E6609 Other obesity due to excess calories: Secondary | ICD-10-CM | POA: Diagnosis not present

## 2019-01-17 DIAGNOSIS — E782 Mixed hyperlipidemia: Secondary | ICD-10-CM | POA: Diagnosis not present

## 2019-01-17 DIAGNOSIS — K210 Gastro-esophageal reflux disease with esophagitis: Secondary | ICD-10-CM | POA: Diagnosis not present

## 2019-01-17 DIAGNOSIS — I10 Essential (primary) hypertension: Secondary | ICD-10-CM | POA: Diagnosis not present

## 2019-01-17 DIAGNOSIS — E1122 Type 2 diabetes mellitus with diabetic chronic kidney disease: Secondary | ICD-10-CM | POA: Diagnosis not present

## 2019-01-17 DIAGNOSIS — E1165 Type 2 diabetes mellitus with hyperglycemia: Secondary | ICD-10-CM | POA: Diagnosis not present

## 2019-01-19 DIAGNOSIS — Z6832 Body mass index (BMI) 32.0-32.9, adult: Secondary | ICD-10-CM | POA: Diagnosis not present

## 2019-01-19 DIAGNOSIS — E782 Mixed hyperlipidemia: Secondary | ICD-10-CM | POA: Diagnosis not present

## 2019-01-19 DIAGNOSIS — E1165 Type 2 diabetes mellitus with hyperglycemia: Secondary | ICD-10-CM | POA: Diagnosis not present

## 2019-01-19 DIAGNOSIS — K210 Gastro-esophageal reflux disease with esophagitis: Secondary | ICD-10-CM | POA: Diagnosis not present

## 2019-01-19 DIAGNOSIS — C189 Malignant neoplasm of colon, unspecified: Secondary | ICD-10-CM | POA: Diagnosis not present

## 2019-01-19 DIAGNOSIS — I10 Essential (primary) hypertension: Secondary | ICD-10-CM | POA: Diagnosis not present

## 2019-01-19 DIAGNOSIS — E1122 Type 2 diabetes mellitus with diabetic chronic kidney disease: Secondary | ICD-10-CM | POA: Diagnosis not present

## 2019-01-19 DIAGNOSIS — K580 Irritable bowel syndrome with diarrhea: Secondary | ICD-10-CM | POA: Diagnosis not present

## 2019-05-18 DIAGNOSIS — K2100 Gastro-esophageal reflux disease with esophagitis, without bleeding: Secondary | ICD-10-CM | POA: Diagnosis not present

## 2019-05-18 DIAGNOSIS — E1165 Type 2 diabetes mellitus with hyperglycemia: Secondary | ICD-10-CM | POA: Diagnosis not present

## 2019-05-18 DIAGNOSIS — E782 Mixed hyperlipidemia: Secondary | ICD-10-CM | POA: Diagnosis not present

## 2019-05-18 DIAGNOSIS — I10 Essential (primary) hypertension: Secondary | ICD-10-CM | POA: Diagnosis not present

## 2019-05-18 DIAGNOSIS — E7800 Pure hypercholesterolemia, unspecified: Secondary | ICD-10-CM | POA: Diagnosis not present

## 2019-05-18 DIAGNOSIS — N183 Chronic kidney disease, stage 3 unspecified: Secondary | ICD-10-CM | POA: Diagnosis not present

## 2019-05-18 DIAGNOSIS — N189 Chronic kidney disease, unspecified: Secondary | ICD-10-CM | POA: Diagnosis not present

## 2019-05-23 DIAGNOSIS — Z6832 Body mass index (BMI) 32.0-32.9, adult: Secondary | ICD-10-CM | POA: Diagnosis not present

## 2019-05-23 DIAGNOSIS — K580 Irritable bowel syndrome with diarrhea: Secondary | ICD-10-CM | POA: Diagnosis not present

## 2019-05-23 DIAGNOSIS — E1122 Type 2 diabetes mellitus with diabetic chronic kidney disease: Secondary | ICD-10-CM | POA: Diagnosis not present

## 2019-05-23 DIAGNOSIS — I10 Essential (primary) hypertension: Secondary | ICD-10-CM | POA: Diagnosis not present

## 2019-05-23 DIAGNOSIS — E782 Mixed hyperlipidemia: Secondary | ICD-10-CM | POA: Diagnosis not present

## 2019-05-23 DIAGNOSIS — C189 Malignant neoplasm of colon, unspecified: Secondary | ICD-10-CM | POA: Diagnosis not present

## 2019-05-23 DIAGNOSIS — Z23 Encounter for immunization: Secondary | ICD-10-CM | POA: Diagnosis not present

## 2019-05-23 DIAGNOSIS — E1165 Type 2 diabetes mellitus with hyperglycemia: Secondary | ICD-10-CM | POA: Diagnosis not present

## 2019-06-14 ENCOUNTER — Encounter (INDEPENDENT_AMBULATORY_CARE_PROVIDER_SITE_OTHER): Payer: Self-pay | Admitting: *Deleted

## 2019-06-16 DIAGNOSIS — N183 Chronic kidney disease, stage 3 unspecified: Secondary | ICD-10-CM | POA: Diagnosis not present

## 2019-06-16 DIAGNOSIS — I10 Essential (primary) hypertension: Secondary | ICD-10-CM | POA: Diagnosis not present

## 2019-06-29 DIAGNOSIS — L570 Actinic keratosis: Secondary | ICD-10-CM | POA: Diagnosis not present

## 2019-09-15 DIAGNOSIS — E1165 Type 2 diabetes mellitus with hyperglycemia: Secondary | ICD-10-CM | POA: Diagnosis not present

## 2019-09-15 DIAGNOSIS — E7849 Other hyperlipidemia: Secondary | ICD-10-CM | POA: Diagnosis not present

## 2019-09-18 DIAGNOSIS — H43812 Vitreous degeneration, left eye: Secondary | ICD-10-CM | POA: Diagnosis not present

## 2019-09-25 DIAGNOSIS — E7800 Pure hypercholesterolemia, unspecified: Secondary | ICD-10-CM | POA: Diagnosis not present

## 2019-09-25 DIAGNOSIS — I10 Essential (primary) hypertension: Secondary | ICD-10-CM | POA: Diagnosis not present

## 2019-09-25 DIAGNOSIS — E1165 Type 2 diabetes mellitus with hyperglycemia: Secondary | ICD-10-CM | POA: Diagnosis not present

## 2019-09-25 DIAGNOSIS — E782 Mixed hyperlipidemia: Secondary | ICD-10-CM | POA: Diagnosis not present

## 2019-09-25 DIAGNOSIS — N183 Chronic kidney disease, stage 3 unspecified: Secondary | ICD-10-CM | POA: Diagnosis not present

## 2019-09-25 DIAGNOSIS — K2100 Gastro-esophageal reflux disease with esophagitis, without bleeding: Secondary | ICD-10-CM | POA: Diagnosis not present

## 2019-09-25 DIAGNOSIS — E1122 Type 2 diabetes mellitus with diabetic chronic kidney disease: Secondary | ICD-10-CM | POA: Diagnosis not present

## 2019-09-28 DIAGNOSIS — K580 Irritable bowel syndrome with diarrhea: Secondary | ICD-10-CM | POA: Diagnosis not present

## 2019-09-28 DIAGNOSIS — I10 Essential (primary) hypertension: Secondary | ICD-10-CM | POA: Diagnosis not present

## 2019-09-28 DIAGNOSIS — M25561 Pain in right knee: Secondary | ICD-10-CM | POA: Diagnosis not present

## 2019-09-28 DIAGNOSIS — C189 Malignant neoplasm of colon, unspecified: Secondary | ICD-10-CM | POA: Diagnosis not present

## 2019-09-28 DIAGNOSIS — Z6832 Body mass index (BMI) 32.0-32.9, adult: Secondary | ICD-10-CM | POA: Diagnosis not present

## 2019-09-28 DIAGNOSIS — E1165 Type 2 diabetes mellitus with hyperglycemia: Secondary | ICD-10-CM | POA: Diagnosis not present

## 2019-09-28 DIAGNOSIS — M79672 Pain in left foot: Secondary | ICD-10-CM | POA: Diagnosis not present

## 2019-09-28 DIAGNOSIS — E1122 Type 2 diabetes mellitus with diabetic chronic kidney disease: Secondary | ICD-10-CM | POA: Diagnosis not present

## 2019-10-12 DIAGNOSIS — D224 Melanocytic nevi of scalp and neck: Secondary | ICD-10-CM | POA: Diagnosis not present

## 2019-10-12 DIAGNOSIS — L570 Actinic keratosis: Secondary | ICD-10-CM | POA: Diagnosis not present

## 2019-10-12 DIAGNOSIS — Z23 Encounter for immunization: Secondary | ICD-10-CM | POA: Diagnosis not present

## 2019-10-30 DIAGNOSIS — H40013 Open angle with borderline findings, low risk, bilateral: Secondary | ICD-10-CM | POA: Diagnosis not present

## 2019-11-10 DIAGNOSIS — Z23 Encounter for immunization: Secondary | ICD-10-CM | POA: Diagnosis not present

## 2019-11-15 DIAGNOSIS — I10 Essential (primary) hypertension: Secondary | ICD-10-CM | POA: Diagnosis not present

## 2019-11-15 DIAGNOSIS — E7849 Other hyperlipidemia: Secondary | ICD-10-CM | POA: Diagnosis not present

## 2019-12-15 DIAGNOSIS — N183 Chronic kidney disease, stage 3 unspecified: Secondary | ICD-10-CM | POA: Diagnosis not present

## 2019-12-15 DIAGNOSIS — K2100 Gastro-esophageal reflux disease with esophagitis, without bleeding: Secondary | ICD-10-CM | POA: Diagnosis not present

## 2019-12-15 DIAGNOSIS — I120 Hypertensive chronic kidney disease with stage 5 chronic kidney disease or end stage renal disease: Secondary | ICD-10-CM | POA: Diagnosis not present

## 2020-01-24 DIAGNOSIS — K2100 Gastro-esophageal reflux disease with esophagitis, without bleeding: Secondary | ICD-10-CM | POA: Diagnosis not present

## 2020-01-24 DIAGNOSIS — E1122 Type 2 diabetes mellitus with diabetic chronic kidney disease: Secondary | ICD-10-CM | POA: Diagnosis not present

## 2020-01-24 DIAGNOSIS — E782 Mixed hyperlipidemia: Secondary | ICD-10-CM | POA: Diagnosis not present

## 2020-01-24 DIAGNOSIS — N183 Chronic kidney disease, stage 3 unspecified: Secondary | ICD-10-CM | POA: Diagnosis not present

## 2020-01-24 DIAGNOSIS — E7800 Pure hypercholesterolemia, unspecified: Secondary | ICD-10-CM | POA: Diagnosis not present

## 2020-01-24 DIAGNOSIS — E1165 Type 2 diabetes mellitus with hyperglycemia: Secondary | ICD-10-CM | POA: Diagnosis not present

## 2020-01-24 DIAGNOSIS — I10 Essential (primary) hypertension: Secondary | ICD-10-CM | POA: Diagnosis not present

## 2020-01-29 DIAGNOSIS — M25561 Pain in right knee: Secondary | ICD-10-CM | POA: Diagnosis not present

## 2020-01-29 DIAGNOSIS — E1165 Type 2 diabetes mellitus with hyperglycemia: Secondary | ICD-10-CM | POA: Diagnosis not present

## 2020-01-29 DIAGNOSIS — I10 Essential (primary) hypertension: Secondary | ICD-10-CM | POA: Diagnosis not present

## 2020-01-29 DIAGNOSIS — Z0001 Encounter for general adult medical examination with abnormal findings: Secondary | ICD-10-CM | POA: Diagnosis not present

## 2020-01-29 DIAGNOSIS — E782 Mixed hyperlipidemia: Secondary | ICD-10-CM | POA: Diagnosis not present

## 2020-01-29 DIAGNOSIS — E6609 Other obesity due to excess calories: Secondary | ICD-10-CM | POA: Diagnosis not present

## 2020-01-29 DIAGNOSIS — M79672 Pain in left foot: Secondary | ICD-10-CM | POA: Diagnosis not present

## 2020-01-29 DIAGNOSIS — E1122 Type 2 diabetes mellitus with diabetic chronic kidney disease: Secondary | ICD-10-CM | POA: Diagnosis not present

## 2020-01-29 DIAGNOSIS — N183 Chronic kidney disease, stage 3 unspecified: Secondary | ICD-10-CM | POA: Diagnosis not present

## 2020-01-29 DIAGNOSIS — Z23 Encounter for immunization: Secondary | ICD-10-CM | POA: Diagnosis not present

## 2020-01-29 DIAGNOSIS — K2100 Gastro-esophageal reflux disease with esophagitis, without bleeding: Secondary | ICD-10-CM | POA: Diagnosis not present

## 2020-01-29 DIAGNOSIS — K580 Irritable bowel syndrome with diarrhea: Secondary | ICD-10-CM | POA: Diagnosis not present

## 2020-01-29 DIAGNOSIS — Z6832 Body mass index (BMI) 32.0-32.9, adult: Secondary | ICD-10-CM | POA: Diagnosis not present

## 2020-01-29 DIAGNOSIS — C189 Malignant neoplasm of colon, unspecified: Secondary | ICD-10-CM | POA: Diagnosis not present

## 2020-03-15 DIAGNOSIS — I129 Hypertensive chronic kidney disease with stage 1 through stage 4 chronic kidney disease, or unspecified chronic kidney disease: Secondary | ICD-10-CM | POA: Diagnosis not present

## 2020-03-15 DIAGNOSIS — E7849 Other hyperlipidemia: Secondary | ICD-10-CM | POA: Diagnosis not present

## 2020-03-15 DIAGNOSIS — N183 Chronic kidney disease, stage 3 unspecified: Secondary | ICD-10-CM | POA: Diagnosis not present

## 2020-03-15 DIAGNOSIS — E1122 Type 2 diabetes mellitus with diabetic chronic kidney disease: Secondary | ICD-10-CM | POA: Diagnosis not present

## 2020-04-11 DIAGNOSIS — L570 Actinic keratosis: Secondary | ICD-10-CM | POA: Diagnosis not present

## 2020-04-11 DIAGNOSIS — D2271 Melanocytic nevi of right lower limb, including hip: Secondary | ICD-10-CM | POA: Diagnosis not present

## 2020-04-11 DIAGNOSIS — L814 Other melanin hyperpigmentation: Secondary | ICD-10-CM | POA: Diagnosis not present

## 2020-04-11 DIAGNOSIS — L538 Other specified erythematous conditions: Secondary | ICD-10-CM | POA: Diagnosis not present

## 2020-04-11 DIAGNOSIS — D225 Melanocytic nevi of trunk: Secondary | ICD-10-CM | POA: Diagnosis not present

## 2020-04-11 DIAGNOSIS — D2272 Melanocytic nevi of left lower limb, including hip: Secondary | ICD-10-CM | POA: Diagnosis not present

## 2020-04-11 DIAGNOSIS — L298 Other pruritus: Secondary | ICD-10-CM | POA: Diagnosis not present

## 2020-04-11 DIAGNOSIS — L821 Other seborrheic keratosis: Secondary | ICD-10-CM | POA: Diagnosis not present

## 2020-04-11 DIAGNOSIS — L820 Inflamed seborrheic keratosis: Secondary | ICD-10-CM | POA: Diagnosis not present

## 2020-04-11 DIAGNOSIS — R58 Hemorrhage, not elsewhere classified: Secondary | ICD-10-CM | POA: Diagnosis not present

## 2020-04-11 DIAGNOSIS — I8393 Asymptomatic varicose veins of bilateral lower extremities: Secondary | ICD-10-CM | POA: Diagnosis not present

## 2020-04-11 DIAGNOSIS — L720 Epidermal cyst: Secondary | ICD-10-CM | POA: Diagnosis not present

## 2020-04-16 DIAGNOSIS — E1122 Type 2 diabetes mellitus with diabetic chronic kidney disease: Secondary | ICD-10-CM | POA: Diagnosis not present

## 2020-04-16 DIAGNOSIS — E7849 Other hyperlipidemia: Secondary | ICD-10-CM | POA: Diagnosis not present

## 2020-04-16 DIAGNOSIS — N183 Chronic kidney disease, stage 3 unspecified: Secondary | ICD-10-CM | POA: Diagnosis not present

## 2020-04-16 DIAGNOSIS — I129 Hypertensive chronic kidney disease with stage 1 through stage 4 chronic kidney disease, or unspecified chronic kidney disease: Secondary | ICD-10-CM | POA: Diagnosis not present

## 2020-05-16 DIAGNOSIS — E1122 Type 2 diabetes mellitus with diabetic chronic kidney disease: Secondary | ICD-10-CM | POA: Diagnosis not present

## 2020-05-16 DIAGNOSIS — I129 Hypertensive chronic kidney disease with stage 1 through stage 4 chronic kidney disease, or unspecified chronic kidney disease: Secondary | ICD-10-CM | POA: Diagnosis not present

## 2020-05-16 DIAGNOSIS — N183 Chronic kidney disease, stage 3 unspecified: Secondary | ICD-10-CM | POA: Diagnosis not present

## 2020-05-23 DIAGNOSIS — U071 COVID-19: Secondary | ICD-10-CM | POA: Diagnosis not present

## 2020-05-23 DIAGNOSIS — R5383 Other fatigue: Secondary | ICD-10-CM | POA: Diagnosis not present

## 2020-05-23 DIAGNOSIS — R519 Headache, unspecified: Secondary | ICD-10-CM | POA: Diagnosis not present

## 2020-05-23 DIAGNOSIS — Z6832 Body mass index (BMI) 32.0-32.9, adult: Secondary | ICD-10-CM | POA: Diagnosis not present

## 2020-05-23 DIAGNOSIS — Z20828 Contact with and (suspected) exposure to other viral communicable diseases: Secondary | ICD-10-CM | POA: Diagnosis not present

## 2020-06-14 DIAGNOSIS — K2100 Gastro-esophageal reflux disease with esophagitis, without bleeding: Secondary | ICD-10-CM | POA: Diagnosis not present

## 2020-06-14 DIAGNOSIS — N183 Chronic kidney disease, stage 3 unspecified: Secondary | ICD-10-CM | POA: Diagnosis not present

## 2020-06-14 DIAGNOSIS — E1122 Type 2 diabetes mellitus with diabetic chronic kidney disease: Secondary | ICD-10-CM | POA: Diagnosis not present

## 2020-06-14 DIAGNOSIS — E7800 Pure hypercholesterolemia, unspecified: Secondary | ICD-10-CM | POA: Diagnosis not present

## 2020-06-14 DIAGNOSIS — E782 Mixed hyperlipidemia: Secondary | ICD-10-CM | POA: Diagnosis not present

## 2020-06-14 DIAGNOSIS — E1165 Type 2 diabetes mellitus with hyperglycemia: Secondary | ICD-10-CM | POA: Diagnosis not present

## 2020-06-15 DIAGNOSIS — I129 Hypertensive chronic kidney disease with stage 1 through stage 4 chronic kidney disease, or unspecified chronic kidney disease: Secondary | ICD-10-CM | POA: Diagnosis not present

## 2020-06-15 DIAGNOSIS — E7849 Other hyperlipidemia: Secondary | ICD-10-CM | POA: Diagnosis not present

## 2020-06-15 DIAGNOSIS — E1122 Type 2 diabetes mellitus with diabetic chronic kidney disease: Secondary | ICD-10-CM | POA: Diagnosis not present

## 2020-06-15 DIAGNOSIS — N183 Chronic kidney disease, stage 3 unspecified: Secondary | ICD-10-CM | POA: Diagnosis not present

## 2020-06-17 DIAGNOSIS — E782 Mixed hyperlipidemia: Secondary | ICD-10-CM | POA: Diagnosis not present

## 2020-06-17 DIAGNOSIS — Z6832 Body mass index (BMI) 32.0-32.9, adult: Secondary | ICD-10-CM | POA: Diagnosis not present

## 2020-06-17 DIAGNOSIS — C189 Malignant neoplasm of colon, unspecified: Secondary | ICD-10-CM | POA: Diagnosis not present

## 2020-06-17 DIAGNOSIS — E1122 Type 2 diabetes mellitus with diabetic chronic kidney disease: Secondary | ICD-10-CM | POA: Diagnosis not present

## 2020-06-17 DIAGNOSIS — K580 Irritable bowel syndrome with diarrhea: Secondary | ICD-10-CM | POA: Diagnosis not present

## 2020-06-17 DIAGNOSIS — E1165 Type 2 diabetes mellitus with hyperglycemia: Secondary | ICD-10-CM | POA: Diagnosis not present

## 2020-06-17 DIAGNOSIS — I10 Essential (primary) hypertension: Secondary | ICD-10-CM | POA: Diagnosis not present

## 2020-06-17 DIAGNOSIS — Z23 Encounter for immunization: Secondary | ICD-10-CM | POA: Diagnosis not present

## 2020-07-16 DIAGNOSIS — E1122 Type 2 diabetes mellitus with diabetic chronic kidney disease: Secondary | ICD-10-CM | POA: Diagnosis not present

## 2020-07-16 DIAGNOSIS — N183 Chronic kidney disease, stage 3 unspecified: Secondary | ICD-10-CM | POA: Diagnosis not present

## 2020-07-16 DIAGNOSIS — I129 Hypertensive chronic kidney disease with stage 1 through stage 4 chronic kidney disease, or unspecified chronic kidney disease: Secondary | ICD-10-CM | POA: Diagnosis not present

## 2020-10-10 DIAGNOSIS — L821 Other seborrheic keratosis: Secondary | ICD-10-CM | POA: Diagnosis not present

## 2020-10-10 DIAGNOSIS — L814 Other melanin hyperpigmentation: Secondary | ICD-10-CM | POA: Diagnosis not present

## 2020-10-10 DIAGNOSIS — L570 Actinic keratosis: Secondary | ICD-10-CM | POA: Diagnosis not present

## 2020-10-10 DIAGNOSIS — D224 Melanocytic nevi of scalp and neck: Secondary | ICD-10-CM | POA: Diagnosis not present

## 2020-10-10 DIAGNOSIS — M720 Palmar fascial fibromatosis [Dupuytren]: Secondary | ICD-10-CM | POA: Diagnosis not present

## 2020-11-07 DIAGNOSIS — E7800 Pure hypercholesterolemia, unspecified: Secondary | ICD-10-CM | POA: Diagnosis not present

## 2020-11-07 DIAGNOSIS — E7849 Other hyperlipidemia: Secondary | ICD-10-CM | POA: Diagnosis not present

## 2020-11-07 DIAGNOSIS — K2100 Gastro-esophageal reflux disease with esophagitis, without bleeding: Secondary | ICD-10-CM | POA: Diagnosis not present

## 2020-11-07 DIAGNOSIS — E1165 Type 2 diabetes mellitus with hyperglycemia: Secondary | ICD-10-CM | POA: Diagnosis not present

## 2020-11-07 DIAGNOSIS — I10 Essential (primary) hypertension: Secondary | ICD-10-CM | POA: Diagnosis not present

## 2020-11-07 DIAGNOSIS — E1122 Type 2 diabetes mellitus with diabetic chronic kidney disease: Secondary | ICD-10-CM | POA: Diagnosis not present

## 2020-11-07 DIAGNOSIS — Z1329 Encounter for screening for other suspected endocrine disorder: Secondary | ICD-10-CM | POA: Diagnosis not present

## 2020-11-07 DIAGNOSIS — N183 Chronic kidney disease, stage 3 unspecified: Secondary | ICD-10-CM | POA: Diagnosis not present

## 2020-11-07 DIAGNOSIS — E782 Mixed hyperlipidemia: Secondary | ICD-10-CM | POA: Diagnosis not present

## 2020-11-11 DIAGNOSIS — E1122 Type 2 diabetes mellitus with diabetic chronic kidney disease: Secondary | ICD-10-CM | POA: Diagnosis not present

## 2020-11-11 DIAGNOSIS — E7849 Other hyperlipidemia: Secondary | ICD-10-CM | POA: Diagnosis not present

## 2020-11-11 DIAGNOSIS — I10 Essential (primary) hypertension: Secondary | ICD-10-CM | POA: Diagnosis not present

## 2020-11-11 DIAGNOSIS — E1165 Type 2 diabetes mellitus with hyperglycemia: Secondary | ICD-10-CM | POA: Diagnosis not present

## 2020-11-11 DIAGNOSIS — K580 Irritable bowel syndrome with diarrhea: Secondary | ICD-10-CM | POA: Diagnosis not present

## 2020-11-11 DIAGNOSIS — E162 Hypoglycemia, unspecified: Secondary | ICD-10-CM | POA: Diagnosis not present

## 2020-11-11 DIAGNOSIS — K219 Gastro-esophageal reflux disease without esophagitis: Secondary | ICD-10-CM | POA: Diagnosis not present

## 2020-11-11 DIAGNOSIS — Z23 Encounter for immunization: Secondary | ICD-10-CM | POA: Diagnosis not present

## 2020-11-13 DIAGNOSIS — I129 Hypertensive chronic kidney disease with stage 1 through stage 4 chronic kidney disease, or unspecified chronic kidney disease: Secondary | ICD-10-CM | POA: Diagnosis not present

## 2020-11-13 DIAGNOSIS — N183 Chronic kidney disease, stage 3 unspecified: Secondary | ICD-10-CM | POA: Diagnosis not present

## 2020-11-13 DIAGNOSIS — E1122 Type 2 diabetes mellitus with diabetic chronic kidney disease: Secondary | ICD-10-CM | POA: Diagnosis not present

## 2020-11-13 DIAGNOSIS — E7849 Other hyperlipidemia: Secondary | ICD-10-CM | POA: Diagnosis not present

## 2020-12-14 DIAGNOSIS — E7849 Other hyperlipidemia: Secondary | ICD-10-CM | POA: Diagnosis not present

## 2020-12-14 DIAGNOSIS — E1122 Type 2 diabetes mellitus with diabetic chronic kidney disease: Secondary | ICD-10-CM | POA: Diagnosis not present

## 2020-12-14 DIAGNOSIS — I129 Hypertensive chronic kidney disease with stage 1 through stage 4 chronic kidney disease, or unspecified chronic kidney disease: Secondary | ICD-10-CM | POA: Diagnosis not present

## 2020-12-14 DIAGNOSIS — N183 Chronic kidney disease, stage 3 unspecified: Secondary | ICD-10-CM | POA: Diagnosis not present

## 2021-01-13 DIAGNOSIS — N183 Chronic kidney disease, stage 3 unspecified: Secondary | ICD-10-CM | POA: Diagnosis not present

## 2021-01-13 DIAGNOSIS — E1122 Type 2 diabetes mellitus with diabetic chronic kidney disease: Secondary | ICD-10-CM | POA: Diagnosis not present

## 2021-01-13 DIAGNOSIS — I129 Hypertensive chronic kidney disease with stage 1 through stage 4 chronic kidney disease, or unspecified chronic kidney disease: Secondary | ICD-10-CM | POA: Diagnosis not present

## 2021-01-13 DIAGNOSIS — E7849 Other hyperlipidemia: Secondary | ICD-10-CM | POA: Diagnosis not present

## 2021-02-05 ENCOUNTER — Encounter (INDEPENDENT_AMBULATORY_CARE_PROVIDER_SITE_OTHER): Payer: Self-pay | Admitting: *Deleted

## 2021-02-12 DIAGNOSIS — H25813 Combined forms of age-related cataract, bilateral: Secondary | ICD-10-CM | POA: Diagnosis not present

## 2021-02-13 DIAGNOSIS — N183 Chronic kidney disease, stage 3 unspecified: Secondary | ICD-10-CM | POA: Diagnosis not present

## 2021-02-13 DIAGNOSIS — E7849 Other hyperlipidemia: Secondary | ICD-10-CM | POA: Diagnosis not present

## 2021-02-13 DIAGNOSIS — E1122 Type 2 diabetes mellitus with diabetic chronic kidney disease: Secondary | ICD-10-CM | POA: Diagnosis not present

## 2021-02-13 DIAGNOSIS — I129 Hypertensive chronic kidney disease with stage 1 through stage 4 chronic kidney disease, or unspecified chronic kidney disease: Secondary | ICD-10-CM | POA: Diagnosis not present

## 2021-02-26 DIAGNOSIS — H25813 Combined forms of age-related cataract, bilateral: Secondary | ICD-10-CM | POA: Diagnosis not present

## 2021-03-06 DIAGNOSIS — I10 Essential (primary) hypertension: Secondary | ICD-10-CM | POA: Diagnosis not present

## 2021-03-06 DIAGNOSIS — N183 Chronic kidney disease, stage 3 unspecified: Secondary | ICD-10-CM | POA: Diagnosis not present

## 2021-03-06 DIAGNOSIS — K2100 Gastro-esophageal reflux disease with esophagitis, without bleeding: Secondary | ICD-10-CM | POA: Diagnosis not present

## 2021-03-06 DIAGNOSIS — E1122 Type 2 diabetes mellitus with diabetic chronic kidney disease: Secondary | ICD-10-CM | POA: Diagnosis not present

## 2021-03-14 DIAGNOSIS — E7849 Other hyperlipidemia: Secondary | ICD-10-CM | POA: Diagnosis not present

## 2021-03-14 DIAGNOSIS — K2100 Gastro-esophageal reflux disease with esophagitis, without bleeding: Secondary | ICD-10-CM | POA: Diagnosis not present

## 2021-03-14 DIAGNOSIS — E162 Hypoglycemia, unspecified: Secondary | ICD-10-CM | POA: Diagnosis not present

## 2021-03-14 DIAGNOSIS — E1165 Type 2 diabetes mellitus with hyperglycemia: Secondary | ICD-10-CM | POA: Diagnosis not present

## 2021-03-14 DIAGNOSIS — I10 Essential (primary) hypertension: Secondary | ICD-10-CM | POA: Diagnosis not present

## 2021-03-14 DIAGNOSIS — E1122 Type 2 diabetes mellitus with diabetic chronic kidney disease: Secondary | ICD-10-CM | POA: Diagnosis not present

## 2021-03-14 DIAGNOSIS — M79672 Pain in left foot: Secondary | ICD-10-CM | POA: Diagnosis not present

## 2021-03-14 DIAGNOSIS — K580 Irritable bowel syndrome with diarrhea: Secondary | ICD-10-CM | POA: Diagnosis not present

## 2021-03-16 DIAGNOSIS — E1122 Type 2 diabetes mellitus with diabetic chronic kidney disease: Secondary | ICD-10-CM | POA: Diagnosis not present

## 2021-03-16 DIAGNOSIS — E7849 Other hyperlipidemia: Secondary | ICD-10-CM | POA: Diagnosis not present

## 2021-03-16 DIAGNOSIS — N183 Chronic kidney disease, stage 3 unspecified: Secondary | ICD-10-CM | POA: Diagnosis not present

## 2021-03-16 DIAGNOSIS — I129 Hypertensive chronic kidney disease with stage 1 through stage 4 chronic kidney disease, or unspecified chronic kidney disease: Secondary | ICD-10-CM | POA: Diagnosis not present

## 2021-03-19 DIAGNOSIS — H25812 Combined forms of age-related cataract, left eye: Secondary | ICD-10-CM | POA: Diagnosis not present

## 2021-03-19 DIAGNOSIS — H25012 Cortical age-related cataract, left eye: Secondary | ICD-10-CM | POA: Diagnosis not present

## 2021-03-27 DIAGNOSIS — M9901 Segmental and somatic dysfunction of cervical region: Secondary | ICD-10-CM | POA: Diagnosis not present

## 2021-03-27 DIAGNOSIS — M47812 Spondylosis without myelopathy or radiculopathy, cervical region: Secondary | ICD-10-CM | POA: Diagnosis not present

## 2021-03-28 DIAGNOSIS — M9901 Segmental and somatic dysfunction of cervical region: Secondary | ICD-10-CM | POA: Diagnosis not present

## 2021-03-28 DIAGNOSIS — M47812 Spondylosis without myelopathy or radiculopathy, cervical region: Secondary | ICD-10-CM | POA: Diagnosis not present

## 2021-03-31 DIAGNOSIS — M9901 Segmental and somatic dysfunction of cervical region: Secondary | ICD-10-CM | POA: Diagnosis not present

## 2021-03-31 DIAGNOSIS — M47812 Spondylosis without myelopathy or radiculopathy, cervical region: Secondary | ICD-10-CM | POA: Diagnosis not present

## 2021-04-02 DIAGNOSIS — M9901 Segmental and somatic dysfunction of cervical region: Secondary | ICD-10-CM | POA: Diagnosis not present

## 2021-04-02 DIAGNOSIS — M47812 Spondylosis without myelopathy or radiculopathy, cervical region: Secondary | ICD-10-CM | POA: Diagnosis not present

## 2021-04-04 DIAGNOSIS — M47812 Spondylosis without myelopathy or radiculopathy, cervical region: Secondary | ICD-10-CM | POA: Diagnosis not present

## 2021-04-04 DIAGNOSIS — M9901 Segmental and somatic dysfunction of cervical region: Secondary | ICD-10-CM | POA: Diagnosis not present

## 2021-04-07 DIAGNOSIS — M7550 Bursitis of unspecified shoulder: Secondary | ICD-10-CM | POA: Diagnosis not present

## 2021-04-07 DIAGNOSIS — H2511 Age-related nuclear cataract, right eye: Secondary | ICD-10-CM | POA: Diagnosis not present

## 2021-04-09 DIAGNOSIS — H25811 Combined forms of age-related cataract, right eye: Secondary | ICD-10-CM | POA: Diagnosis not present

## 2021-04-09 DIAGNOSIS — H25011 Cortical age-related cataract, right eye: Secondary | ICD-10-CM | POA: Diagnosis not present

## 2021-04-11 DIAGNOSIS — M47812 Spondylosis without myelopathy or radiculopathy, cervical region: Secondary | ICD-10-CM | POA: Diagnosis not present

## 2021-04-11 DIAGNOSIS — M9901 Segmental and somatic dysfunction of cervical region: Secondary | ICD-10-CM | POA: Diagnosis not present

## 2021-04-15 DIAGNOSIS — M9901 Segmental and somatic dysfunction of cervical region: Secondary | ICD-10-CM | POA: Diagnosis not present

## 2021-04-15 DIAGNOSIS — M47812 Spondylosis without myelopathy or radiculopathy, cervical region: Secondary | ICD-10-CM | POA: Diagnosis not present

## 2021-04-16 DIAGNOSIS — E1122 Type 2 diabetes mellitus with diabetic chronic kidney disease: Secondary | ICD-10-CM | POA: Diagnosis not present

## 2021-04-16 DIAGNOSIS — N183 Chronic kidney disease, stage 3 unspecified: Secondary | ICD-10-CM | POA: Diagnosis not present

## 2021-04-16 DIAGNOSIS — E7849 Other hyperlipidemia: Secondary | ICD-10-CM | POA: Diagnosis not present

## 2021-04-16 DIAGNOSIS — I129 Hypertensive chronic kidney disease with stage 1 through stage 4 chronic kidney disease, or unspecified chronic kidney disease: Secondary | ICD-10-CM | POA: Diagnosis not present

## 2021-04-18 DIAGNOSIS — M7552 Bursitis of left shoulder: Secondary | ICD-10-CM | POA: Diagnosis not present

## 2021-04-18 DIAGNOSIS — M7542 Impingement syndrome of left shoulder: Secondary | ICD-10-CM | POA: Diagnosis not present

## 2021-05-06 DIAGNOSIS — S46011D Strain of muscle(s) and tendon(s) of the rotator cuff of right shoulder, subsequent encounter: Secondary | ICD-10-CM | POA: Diagnosis not present

## 2021-05-26 DIAGNOSIS — M7552 Bursitis of left shoulder: Secondary | ICD-10-CM | POA: Diagnosis not present

## 2021-05-26 DIAGNOSIS — M75102 Unspecified rotator cuff tear or rupture of left shoulder, not specified as traumatic: Secondary | ICD-10-CM | POA: Diagnosis not present

## 2021-05-26 DIAGNOSIS — S46012A Strain of muscle(s) and tendon(s) of the rotator cuff of left shoulder, initial encounter: Secondary | ICD-10-CM | POA: Diagnosis not present

## 2021-06-10 DIAGNOSIS — S46011D Strain of muscle(s) and tendon(s) of the rotator cuff of right shoulder, subsequent encounter: Secondary | ICD-10-CM | POA: Diagnosis not present

## 2021-06-11 ENCOUNTER — Other Ambulatory Visit (HOSPITAL_COMMUNITY): Payer: Self-pay | Admitting: Orthopedic Surgery

## 2021-06-11 DIAGNOSIS — Z85038 Personal history of other malignant neoplasm of large intestine: Secondary | ICD-10-CM

## 2021-06-11 DIAGNOSIS — S42135A Nondisplaced fracture of coracoid process, left shoulder, initial encounter for closed fracture: Secondary | ICD-10-CM

## 2021-06-18 ENCOUNTER — Encounter (HOSPITAL_COMMUNITY)
Admission: RE | Admit: 2021-06-18 | Discharge: 2021-06-18 | Disposition: A | Discharge status: Home / Self Care | Payer: Medicare Other | Source: Ambulatory Visit | Attending: Orthopedic Surgery | Admitting: Orthopedic Surgery

## 2021-06-18 ENCOUNTER — Other Ambulatory Visit: Payer: Self-pay

## 2021-06-18 DIAGNOSIS — M170 Bilateral primary osteoarthritis of knee: Secondary | ICD-10-CM | POA: Diagnosis not present

## 2021-06-18 DIAGNOSIS — C189 Malignant neoplasm of colon, unspecified: Secondary | ICD-10-CM | POA: Diagnosis not present

## 2021-06-18 DIAGNOSIS — Z85038 Personal history of other malignant neoplasm of large intestine: Secondary | ICD-10-CM

## 2021-06-18 DIAGNOSIS — M19012 Primary osteoarthritis, left shoulder: Secondary | ICD-10-CM | POA: Diagnosis not present

## 2021-06-18 DIAGNOSIS — S42135A Nondisplaced fracture of coracoid process, left shoulder, initial encounter for closed fracture: Secondary | ICD-10-CM | POA: Diagnosis not present

## 2021-06-18 DIAGNOSIS — M19011 Primary osteoarthritis, right shoulder: Secondary | ICD-10-CM | POA: Diagnosis not present

## 2021-06-18 MED ORDER — TECHNETIUM TC 99M MEDRONATE IV KIT
20.0000 | PACK | Freq: Once | INTRAVENOUS | Status: AC | PRN
  Administered 2021-06-18: 19.8 via INTRAVENOUS

## 2021-06-24 DIAGNOSIS — S46012D Strain of muscle(s) and tendon(s) of the rotator cuff of left shoulder, subsequent encounter: Secondary | ICD-10-CM | POA: Diagnosis not present

## 2021-06-27 DIAGNOSIS — E782 Mixed hyperlipidemia: Secondary | ICD-10-CM | POA: Diagnosis not present

## 2021-06-27 DIAGNOSIS — E7849 Other hyperlipidemia: Secondary | ICD-10-CM | POA: Diagnosis not present

## 2021-06-27 DIAGNOSIS — I10 Essential (primary) hypertension: Secondary | ICD-10-CM | POA: Diagnosis not present

## 2021-06-27 DIAGNOSIS — N189 Chronic kidney disease, unspecified: Secondary | ICD-10-CM | POA: Diagnosis not present

## 2021-06-27 DIAGNOSIS — E1122 Type 2 diabetes mellitus with diabetic chronic kidney disease: Secondary | ICD-10-CM | POA: Diagnosis not present

## 2021-06-27 DIAGNOSIS — K2100 Gastro-esophageal reflux disease with esophagitis, without bleeding: Secondary | ICD-10-CM | POA: Diagnosis not present

## 2021-06-30 DIAGNOSIS — M75101 Unspecified rotator cuff tear or rupture of right shoulder, not specified as traumatic: Secondary | ICD-10-CM | POA: Diagnosis not present

## 2021-06-30 DIAGNOSIS — M75102 Unspecified rotator cuff tear or rupture of left shoulder, not specified as traumatic: Secondary | ICD-10-CM | POA: Diagnosis not present

## 2021-07-02 DIAGNOSIS — E1165 Type 2 diabetes mellitus with hyperglycemia: Secondary | ICD-10-CM | POA: Diagnosis not present

## 2021-07-02 DIAGNOSIS — E7849 Other hyperlipidemia: Secondary | ICD-10-CM | POA: Diagnosis not present

## 2021-07-02 DIAGNOSIS — E1122 Type 2 diabetes mellitus with diabetic chronic kidney disease: Secondary | ICD-10-CM | POA: Diagnosis not present

## 2021-07-02 DIAGNOSIS — M75102 Unspecified rotator cuff tear or rupture of left shoulder, not specified as traumatic: Secondary | ICD-10-CM | POA: Diagnosis not present

## 2021-07-02 DIAGNOSIS — E162 Hypoglycemia, unspecified: Secondary | ICD-10-CM | POA: Diagnosis not present

## 2021-07-02 DIAGNOSIS — M75101 Unspecified rotator cuff tear or rupture of right shoulder, not specified as traumatic: Secondary | ICD-10-CM | POA: Diagnosis not present

## 2021-07-02 DIAGNOSIS — Z23 Encounter for immunization: Secondary | ICD-10-CM | POA: Diagnosis not present

## 2021-07-02 DIAGNOSIS — K2100 Gastro-esophageal reflux disease with esophagitis, without bleeding: Secondary | ICD-10-CM | POA: Diagnosis not present

## 2021-07-02 DIAGNOSIS — I10 Essential (primary) hypertension: Secondary | ICD-10-CM | POA: Diagnosis not present

## 2021-07-02 DIAGNOSIS — K580 Irritable bowel syndrome with diarrhea: Secondary | ICD-10-CM | POA: Diagnosis not present

## 2021-07-17 DIAGNOSIS — M75102 Unspecified rotator cuff tear or rupture of left shoulder, not specified as traumatic: Secondary | ICD-10-CM | POA: Diagnosis not present

## 2021-07-17 DIAGNOSIS — M75101 Unspecified rotator cuff tear or rupture of right shoulder, not specified as traumatic: Secondary | ICD-10-CM | POA: Diagnosis not present

## 2021-07-22 DIAGNOSIS — M75102 Unspecified rotator cuff tear or rupture of left shoulder, not specified as traumatic: Secondary | ICD-10-CM | POA: Diagnosis not present

## 2021-07-22 DIAGNOSIS — M75101 Unspecified rotator cuff tear or rupture of right shoulder, not specified as traumatic: Secondary | ICD-10-CM | POA: Diagnosis not present

## 2021-07-24 DIAGNOSIS — M75102 Unspecified rotator cuff tear or rupture of left shoulder, not specified as traumatic: Secondary | ICD-10-CM | POA: Diagnosis not present

## 2021-07-24 DIAGNOSIS — M75101 Unspecified rotator cuff tear or rupture of right shoulder, not specified as traumatic: Secondary | ICD-10-CM | POA: Diagnosis not present

## 2021-07-29 DIAGNOSIS — M75102 Unspecified rotator cuff tear or rupture of left shoulder, not specified as traumatic: Secondary | ICD-10-CM | POA: Diagnosis not present

## 2021-07-29 DIAGNOSIS — M75101 Unspecified rotator cuff tear or rupture of right shoulder, not specified as traumatic: Secondary | ICD-10-CM | POA: Diagnosis not present

## 2021-07-30 DIAGNOSIS — J320 Chronic maxillary sinusitis: Secondary | ICD-10-CM | POA: Diagnosis not present

## 2021-07-31 DIAGNOSIS — M75102 Unspecified rotator cuff tear or rupture of left shoulder, not specified as traumatic: Secondary | ICD-10-CM | POA: Diagnosis not present

## 2021-07-31 DIAGNOSIS — M75101 Unspecified rotator cuff tear or rupture of right shoulder, not specified as traumatic: Secondary | ICD-10-CM | POA: Diagnosis not present

## 2021-08-05 DIAGNOSIS — M75102 Unspecified rotator cuff tear or rupture of left shoulder, not specified as traumatic: Secondary | ICD-10-CM | POA: Diagnosis not present

## 2021-08-05 DIAGNOSIS — M75101 Unspecified rotator cuff tear or rupture of right shoulder, not specified as traumatic: Secondary | ICD-10-CM | POA: Diagnosis not present

## 2021-08-07 DIAGNOSIS — M75102 Unspecified rotator cuff tear or rupture of left shoulder, not specified as traumatic: Secondary | ICD-10-CM | POA: Diagnosis not present

## 2021-08-07 DIAGNOSIS — M75101 Unspecified rotator cuff tear or rupture of right shoulder, not specified as traumatic: Secondary | ICD-10-CM | POA: Diagnosis not present

## 2021-08-12 DIAGNOSIS — M75101 Unspecified rotator cuff tear or rupture of right shoulder, not specified as traumatic: Secondary | ICD-10-CM | POA: Diagnosis not present

## 2021-08-12 DIAGNOSIS — M75102 Unspecified rotator cuff tear or rupture of left shoulder, not specified as traumatic: Secondary | ICD-10-CM | POA: Diagnosis not present

## 2021-08-19 DIAGNOSIS — S46011D Strain of muscle(s) and tendon(s) of the rotator cuff of right shoulder, subsequent encounter: Secondary | ICD-10-CM | POA: Diagnosis not present

## 2021-10-27 DIAGNOSIS — J320 Chronic maxillary sinusitis: Secondary | ICD-10-CM | POA: Diagnosis not present

## 2021-10-29 DIAGNOSIS — E1122 Type 2 diabetes mellitus with diabetic chronic kidney disease: Secondary | ICD-10-CM | POA: Diagnosis not present

## 2021-10-29 DIAGNOSIS — E782 Mixed hyperlipidemia: Secondary | ICD-10-CM | POA: Diagnosis not present

## 2021-10-29 DIAGNOSIS — E7849 Other hyperlipidemia: Secondary | ICD-10-CM | POA: Diagnosis not present

## 2021-10-29 DIAGNOSIS — E1165 Type 2 diabetes mellitus with hyperglycemia: Secondary | ICD-10-CM | POA: Diagnosis not present

## 2021-10-29 DIAGNOSIS — E7800 Pure hypercholesterolemia, unspecified: Secondary | ICD-10-CM | POA: Diagnosis not present

## 2021-10-29 DIAGNOSIS — N183 Chronic kidney disease, stage 3 unspecified: Secondary | ICD-10-CM | POA: Diagnosis not present

## 2021-11-03 DIAGNOSIS — R059 Cough, unspecified: Secondary | ICD-10-CM | POA: Diagnosis not present

## 2021-11-03 DIAGNOSIS — K580 Irritable bowel syndrome with diarrhea: Secondary | ICD-10-CM | POA: Diagnosis not present

## 2021-11-03 DIAGNOSIS — I10 Essential (primary) hypertension: Secondary | ICD-10-CM | POA: Diagnosis not present

## 2021-11-03 DIAGNOSIS — E162 Hypoglycemia, unspecified: Secondary | ICD-10-CM | POA: Diagnosis not present

## 2021-11-03 DIAGNOSIS — E1165 Type 2 diabetes mellitus with hyperglycemia: Secondary | ICD-10-CM | POA: Diagnosis not present

## 2021-11-03 DIAGNOSIS — E1122 Type 2 diabetes mellitus with diabetic chronic kidney disease: Secondary | ICD-10-CM | POA: Diagnosis not present

## 2021-11-03 DIAGNOSIS — E7849 Other hyperlipidemia: Secondary | ICD-10-CM | POA: Diagnosis not present

## 2021-11-03 DIAGNOSIS — N189 Chronic kidney disease, unspecified: Secondary | ICD-10-CM | POA: Diagnosis not present

## 2021-11-04 ENCOUNTER — Encounter (INDEPENDENT_AMBULATORY_CARE_PROVIDER_SITE_OTHER): Payer: Self-pay | Admitting: *Deleted

## 2021-12-17 ENCOUNTER — Encounter (INDEPENDENT_AMBULATORY_CARE_PROVIDER_SITE_OTHER): Payer: Self-pay

## 2021-12-24 ENCOUNTER — Other Ambulatory Visit (INDEPENDENT_AMBULATORY_CARE_PROVIDER_SITE_OTHER): Payer: Self-pay

## 2021-12-24 DIAGNOSIS — Z86010 Personal history of colonic polyps: Secondary | ICD-10-CM

## 2021-12-24 DIAGNOSIS — Z85038 Personal history of other malignant neoplasm of large intestine: Secondary | ICD-10-CM

## 2022-01-02 ENCOUNTER — Telehealth (INDEPENDENT_AMBULATORY_CARE_PROVIDER_SITE_OTHER): Payer: Self-pay

## 2022-01-02 ENCOUNTER — Encounter (INDEPENDENT_AMBULATORY_CARE_PROVIDER_SITE_OTHER): Payer: Self-pay

## 2022-01-02 MED ORDER — NA SULFATE-K SULFATE-MG SULF 17.5-3.13-1.6 GM/177ML PO SOLN: 1.0000 | ORAL | 0 refills | Status: DC

## 2022-01-02 NOTE — Telephone Encounter (Signed)
Karim Aiello Ann Khamron Gellert, CMA  ?

## 2022-01-02 NOTE — Telephone Encounter (Signed)
Referring MD/PCP: Sasser  Procedure: Tcs  Reason/Indication:  hx of polyps, hx of colon ca  Has patient had this procedure before?  yes  If so, when, by whom and where?  2015  Is there a family history of colon cancer?  no  Who?  What age when diagnosed?    Is patient diabetic? If yes, Type 1 or Type 2   yes, type 2       Does patient have prosthetic heart valve or mechanical valve?  No   Do you have a pacemaker/defibrillator?  no  Has patient ever had endocarditis/atrial fibrillation? no  Does patient use oxygen? no  Has patient had joint replacement within last 12 months?  no  Is patient constipated or do they take laxatives? no  Does patient have a history of alcohol/drug use?  no  Have you had a stroke/heart attack last 6 mths? no  Do you take medicine for weight loss?  no  For female patients,: have you had a hysterectomy no                       are you post menopausal yes                      do you still have your menstrual cycle no  Is patient on blood thinner such as Coumadin, Plavix and/or Aspirin? yes  Medications: asa 81 mg daily, metformin 1000 mg bid, glipizide 2.5 mg daily, valsartan/hctz 160/12.5 mg daily, simvastatin 10 mg daily, pantoprazole 40 mg daily, dicylomine 10 mg tid, calcium 600 mg daily, centrum  silver daily  Allergies: phenobarbitol   Medication Adjustment per Dr Jenetta Downer no metformin the evening prior no gilpizide or metformin the morning of procedure  Procedure date & time: 02/03/22 at 7:30

## 2022-01-05 ENCOUNTER — Encounter (INDEPENDENT_AMBULATORY_CARE_PROVIDER_SITE_OTHER): Payer: Self-pay | Admitting: *Deleted

## 2022-01-29 NOTE — Patient Instructions (Signed)
DAKIYAH HEINKE  01/29/2022     '@PREFPERIOPPHARMACY'$ @   Your procedure is scheduled on  02/03/2022.   Report to [facility] at  0600 A.M.   Call this number if you have problems the morning of surgery:  [phone]   Remember:  Follow the diet and prep instructions given to you by the office.     DO NOT take any medications for diabetes the morning of your procedure.     Take these medicines the morning of surgery with A SIP OF WATER                                        protonix.     Do not wear jewelry, make-up or nail polish.  Do not wear lotions, powders, or perfumes, or deodorant.  Do not shave 48 hours prior to surgery.  Men may shave face and neck.  Do not bring valuables to the hospital.  [facility] is not responsible for any belongings or valuables.  Contacts, dentures or bridgework may not be worn into surgery.  Leave your suitcase in the car.  After surgery it may be brought to your room.  For patients admitted to the hospital, discharge time will be determined by your treatment team.  Patients discharged the day of surgery will not be allowed to drive home and must have someone with them for 24 hours.    Special instructions:   DO NOT smoke tobacco or vape for 24 hours before your procedure.  Please read over the following fact sheets that you were given. Anesthesia Post-op Instructions and Care and Recovery After Surgery      Colonoscopy, Adult, Care After The following information offers guidance on how to care for yourself after your procedure. Your health care provider may also give you more specific instructions. If you have problems or questions, contact your health care provider. What can I expect after the procedure? After the procedure, it is common to have: A small amount of blood in your stool for 24 hours after the procedure. Some gas. Mild cramping or bloating of your abdomen. Follow these instructions at home: Eating and  drinking  Drink enough fluid to keep your urine pale yellow. Follow instructions from your health care provider about eating or drinking restrictions. Resume your normal diet as told by your health care provider. Avoid heavy or fried foods that are hard to digest. Activity Rest as told by your health care provider. Avoid sitting for a long time without moving. Get up to take short walks every 1-2 hours. This is important to improve blood flow and breathing. Ask for help if you feel weak or unsteady. Return to your normal activities as told by your health care provider. Ask your health care provider what activities are safe for you. Managing cramping and bloating  Try walking around when you have cramps or feel bloated. If directed, apply heat to your abdomen as told by your health care provider. Use the heat source that your health care provider recommends, such as a moist heat pack or a heating pad. Place a towel between your skin and the heat source. Leave the heat on for 20-30 minutes. Remove the heat if your skin turns bright red. This is especially important if you are unable to feel pain, heat, or cold. You have a greater risk of getting  burned. General instructions If you were given a sedative during the procedure, it can affect you for several hours. Do not drive or operate machinery until your health care provider says that it is safe. For the first 24 hours after the procedure: Do not sign important documents. Do not drink alcohol. Do your regular daily activities at a slower pace than normal. Eat soft foods that are easy to digest. Take over-the-counter and prescription medicines only as told by your health care provider. Keep all follow-up visits. This is important. Contact a health care provider if: You have blood in your stool 2-3 days after the procedure. Get help right away if: You have more than a small spotting of blood in your stool. You have large blood clots in your  stool. You have swelling of your abdomen. You have nausea or vomiting. You have a fever. You have increasing pain in your abdomen that is not relieved with medicine. These symptoms may be an emergency. Get help right away. Call 911. Do not wait to see if the symptoms will go away. Do not drive yourself to the hospital. Summary After the procedure, it is common to have a small amount of blood in your stool. You may also have mild cramping and bloating of your abdomen. If you were given a sedative during the procedure, it can affect you for several hours. Do not drive or operate machinery until your health care provider says that it is safe. Get help right away if you have a lot of blood in your stool, nausea or vomiting, a fever, or increased pain in your abdomen. This information is not intended to replace advice given to you by your health care provider. Make sure you discuss any questions you have with your health care provider. Document Revised: 03/26/2021 Document Reviewed: 03/26/2021 Elsevier Patient Education  [place] After This sheet gives you information about how to care for yourself after your procedure. Your health care provider may also give you more specific instructions. If you have problems or questions, contact your health care provider. What can I expect after the procedure? After the procedure, it is common to have: Tiredness. Forgetfulness about what happened after the procedure. Impaired judgment for important decisions. Nausea or vomiting. Some difficulty with balance. Follow these instructions at home: For the time period you were told by your health care provider:     Rest as needed. Do not participate in activities where you could fall or become injured. Do not drive or use machinery. Do not drink alcohol. Do not take sleeping pills or medicines that cause drowsiness. Do not make important decisions or sign legal  documents. Do not take care of children on your own. Eating and drinking Follow the diet that is recommended by your health care provider. Drink enough fluid to keep your urine pale yellow. If you vomit: Drink water, juice, or soup when you can drink without vomiting. Make sure you have little or no nausea before eating solid foods. General instructions Have a responsible adult stay with you for the time you are told. It is important to have someone help care for you until you are awake and alert. Take over-the-counter and prescription medicines only as told by your health care provider. If you have sleep apnea, surgery and certain medicines can increase your risk for breathing problems. Follow instructions from your health care provider about wearing your sleep device: Anytime you are sleeping, including during daytime naps. While  taking prescription pain medicines, sleeping medicines, or medicines that make you drowsy. Avoid smoking. Keep all follow-up visits as told by your health care provider. This is important. Contact a health care provider if: You keep feeling nauseous or you keep vomiting. You feel light-headed. You are still sleepy or having trouble with balance after 24 hours. You develop a rash. You have a fever. You have redness or swelling around the IV site. Get help right away if: You have trouble breathing. You have new-onset confusion at home. Summary For several hours after your procedure, you may feel tired. You may also be forgetful and have poor judgment. Have a responsible adult stay with you for the time you are told. It is important to have someone help care for you until you are awake and alert. Rest as told. Do not drive or operate machinery. Do not drink alcohol or take sleeping pills. Get help right away if you have trouble breathing, or if you suddenly become confused. This information is not intended to replace advice given to you by your health care  provider. Make sure you discuss any questions you have with your health care provider. Document Revised: 07/08/2021 Document Reviewed: 07/06/2019 Elsevier Patient Education  [place].

## 2022-01-30 ENCOUNTER — Encounter (HOSPITAL_COMMUNITY): Payer: Self-pay

## 2022-01-30 ENCOUNTER — Encounter (HOSPITAL_COMMUNITY)
Admission: RE | Admit: 2022-01-30 | Discharge: 2022-01-30 | Disposition: A | Discharge status: Home / Self Care | Payer: Medicare Other | Source: Ambulatory Visit | Attending: Gastroenterology | Admitting: Gastroenterology

## 2022-01-30 VITALS — BP 158/63 | HR 87 | Temp 97.9°F | Resp 18 | Ht 61.5 in | Wt 187.6 lb

## 2022-01-30 DIAGNOSIS — I10 Essential (primary) hypertension: Secondary | ICD-10-CM | POA: Diagnosis not present

## 2022-01-30 DIAGNOSIS — Z86010 Personal history of colonic polyps: Secondary | ICD-10-CM | POA: Diagnosis not present

## 2022-01-30 DIAGNOSIS — Z85038 Personal history of other malignant neoplasm of large intestine: Secondary | ICD-10-CM

## 2022-01-30 DIAGNOSIS — Z01818 Encounter for other preprocedural examination: Secondary | ICD-10-CM | POA: Insufficient documentation

## 2022-01-30 LAB — BASIC METABOLIC PANEL
Anion gap: 11 (ref 5–15)
BUN: 24 mg/dL — ABNORMAL HIGH (ref 8–23)
CO2: 26 mmol/L (ref 22–32)
Calcium: 8.9 mg/dL (ref 8.9–10.3)
Chloride: 101 mmol/L (ref 98–111)
Creatinine, Ser: 1.29 mg/dL — ABNORMAL HIGH (ref 0.44–1.00)
GFR, Estimated: 44 mL/min — ABNORMAL LOW (ref >60)
Glucose, Bld: 159 mg/dL — ABNORMAL HIGH (ref 70–99)
Potassium: 4 mmol/L (ref 3.5–5.1)
Sodium: 138 mmol/L (ref 135–145)

## 2022-02-03 ENCOUNTER — Encounter (HOSPITAL_COMMUNITY): Payer: Self-pay | Admitting: Gastroenterology

## 2022-02-03 ENCOUNTER — Encounter (HOSPITAL_COMMUNITY)
Admission: RE | Disposition: A | Discharge status: Home / Self Care | Payer: Self-pay | Source: Home / Self Care | Attending: Gastroenterology

## 2022-02-03 ENCOUNTER — Encounter (INDEPENDENT_AMBULATORY_CARE_PROVIDER_SITE_OTHER): Payer: Self-pay | Admitting: *Deleted

## 2022-02-03 ENCOUNTER — Ambulatory Visit (HOSPITAL_BASED_OUTPATIENT_CLINIC_OR_DEPARTMENT_OTHER): Payer: Medicare Other | Admitting: Anesthesiology

## 2022-02-03 ENCOUNTER — Ambulatory Visit (HOSPITAL_COMMUNITY): Payer: Medicare Other | Admitting: Anesthesiology

## 2022-02-03 ENCOUNTER — Ambulatory Visit (HOSPITAL_COMMUNITY)
Admission: RE | Admit: 2022-02-03 | Discharge: 2022-02-03 | Disposition: A | Discharge status: Home / Self Care | Payer: Medicare Other | Attending: Gastroenterology | Admitting: Gastroenterology

## 2022-02-03 DIAGNOSIS — K635 Polyp of colon: Secondary | ICD-10-CM

## 2022-02-03 DIAGNOSIS — I10 Essential (primary) hypertension: Secondary | ICD-10-CM

## 2022-02-03 DIAGNOSIS — Z7984 Long term (current) use of oral hypoglycemic drugs: Secondary | ICD-10-CM | POA: Diagnosis not present

## 2022-02-03 DIAGNOSIS — G40909 Epilepsy, unspecified, not intractable, without status epilepticus: Secondary | ICD-10-CM | POA: Diagnosis not present

## 2022-02-03 DIAGNOSIS — K648 Other hemorrhoids: Secondary | ICD-10-CM

## 2022-02-03 DIAGNOSIS — K219 Gastro-esophageal reflux disease without esophagitis: Secondary | ICD-10-CM | POA: Insufficient documentation

## 2022-02-03 DIAGNOSIS — Z980 Intestinal bypass and anastomosis status: Secondary | ICD-10-CM

## 2022-02-03 DIAGNOSIS — K5730 Diverticulosis of large intestine without perforation or abscess without bleeding: Secondary | ICD-10-CM

## 2022-02-03 DIAGNOSIS — Z08 Encounter for follow-up examination after completed treatment for malignant neoplasm: Secondary | ICD-10-CM | POA: Diagnosis not present

## 2022-02-03 DIAGNOSIS — E119 Type 2 diabetes mellitus without complications: Secondary | ICD-10-CM | POA: Insufficient documentation

## 2022-02-03 DIAGNOSIS — K449 Diaphragmatic hernia without obstruction or gangrene: Secondary | ICD-10-CM | POA: Diagnosis not present

## 2022-02-03 DIAGNOSIS — D122 Benign neoplasm of ascending colon: Secondary | ICD-10-CM | POA: Insufficient documentation

## 2022-02-03 DIAGNOSIS — R195 Other fecal abnormalities: Secondary | ICD-10-CM

## 2022-02-03 DIAGNOSIS — Z85038 Personal history of other malignant neoplasm of large intestine: Secondary | ICD-10-CM | POA: Insufficient documentation

## 2022-02-03 DIAGNOSIS — Z86010 Personal history of colonic polyps: Secondary | ICD-10-CM

## 2022-02-03 DIAGNOSIS — Z1211 Encounter for screening for malignant neoplasm of colon: Secondary | ICD-10-CM | POA: Insufficient documentation

## 2022-02-03 HISTORY — PX: POLYPECTOMY: SHX5525

## 2022-02-03 HISTORY — PX: COLONOSCOPY WITH PROPOFOL: SHX5780

## 2022-02-03 LAB — HM COLONOSCOPY

## 2022-02-03 LAB — GLUCOSE, CAPILLARY: Glucose-Capillary: 220 mg/dL — ABNORMAL HIGH (ref 70–99)

## 2022-02-03 SURGERY — COLONOSCOPY WITH PROPOFOL
Anesthesia: General

## 2022-02-03 MED ORDER — PROPOFOL 10 MG/ML IV BOLUS
INTRAVENOUS | Status: DC | PRN
  Administered 2022-02-03: 80 mg via INTRAVENOUS

## 2022-02-03 MED ORDER — LIDOCAINE HCL (CARDIAC) PF 100 MG/5ML IV SOSY
PREFILLED_SYRINGE | INTRAVENOUS | Status: DC | PRN
  Administered 2022-02-03: 50 mg via INTRAVENOUS

## 2022-02-03 MED ORDER — LACTATED RINGERS IV SOLN
INTRAVENOUS | Status: DC
Start: 2022-02-03 — End: 2022-02-03

## 2022-02-03 MED ORDER — PROPOFOL 500 MG/50ML IV EMUL
INTRAVENOUS | Status: DC | PRN
  Administered 2022-02-03: 200 ug/kg/min via INTRAVENOUS

## 2022-02-03 NOTE — Transfer of Care (Signed)
Immediate Anesthesia Transfer of Care Note  Patient: Sheri [name]  Procedure(s) Performed: COLONOSCOPY WITH PROPOFOL POLYPECTOMY  Patient Location: Short Stay  Anesthesia Type:MAC  Level of Consciousness: awake, alert , oriented and patient cooperative  Airway & Oxygen Therapy: Patient Spontanous Breathing and Patient connected to nasal cannula oxygen  Post-op Assessment: Report given to RN, Post -op Vital signs reviewed and stable and Patient moving all extremities  Post vital signs: Reviewed and stable  Last Vitals:  Vitals Value Taken Time  BP    Temp    Pulse    Resp    SpO2      Last Pain:  Vitals:   02/03/22 0752  PainSc: 0-No pain         Complications: No notable events documented.

## 2022-02-03 NOTE — Op Note (Signed)
[facility] Patient Name: Sheri [name] Procedure Date: 02/03/2022 7:20 AM MRN: [id number] Date of Birth: Feb 09, 1951 Attending MD: Maylon Peppers ,  CSN: [id number] Age: 71 Admit Type: Outpatient Procedure:                Colonoscopy Indications:              High risk colon cancer surveillance: Personal                            history of colon cancer Providers:                Maylon Peppers, Lambert Mody, Everardo Pacific, Randa Spike, Technician Referring MD:              Medicines:                Monitored Anesthesia Care Complications:            No immediate complications. Estimated Blood Loss:     Estimated blood loss: none. Procedure:                Pre-Anesthesia Assessment:                           - Prior to the procedure, a History and Physical                            was performed, and patient medications, allergies                            and sensitivities were reviewed. The patient's                            tolerance of previous anesthesia was reviewed.                           - The risks and benefits of the procedure and the                            sedation options and risks were discussed with the                            patient. All questions were answered and informed                            consent was obtained.                           - ASA Grade Assessment: II - A patient with mild                            systemic disease.                           After obtaining informed consent, the colonoscope  was passed under direct vision. Throughout the                            procedure, the patient's blood pressure, pulse, and                            oxygen saturations were monitored continuously. The                            [id number]-HQ190L ([id number]) scope was introduced through                            the anus and advanced to the the cecum, identified                             by appendiceal orifice and ileocecal valve. The                            colonoscopy was performed without difficulty. The                            patient tolerated the procedure well. The quality                            of the bowel preparation was excellent. Scope In: 7:58:06 AM Scope Out: 8:14:59 AM Scope Withdrawal Time: 0 hours 11 minutes 35 seconds  Total Procedure Duration: 0 hours 16 minutes 53 seconds  Findings:      Hemorrhoids were found on perianal exam.      A 3 mm polyp was found in the ascending colon. The polyp was sessile.       The polyp was removed with a cold snare. Resection and retrieval were       complete.      A few small-mouthed diverticula were found in the sigmoid colon.      There was evidence of a prior end-to-end colo-colonic anastomosis in the       recto-sigmoid colon. This was patent and was characterized by healthy       appearing mucosa. The anastomosis was traversed.      Non-bleeding external and internal hemorrhoids were found during       retroflexion. The hemorrhoids were medium-sized. Impression:               - Hemorrhoids found on perianal exam.                           - One 3 mm polyp in the ascending colon, removed                            with a cold snare. Resected and retrieved.                           - Diverticulosis in the sigmoid colon.                           - Patent end-to-end colo-colonic anastomosis,  characterized by healthy appearing mucosa.                           - Non-bleeding external and internal hemorrhoids. Moderate Sedation:      Per Anesthesia Care Recommendation:           - Discharge patient to home (ambulatory).                           - Resume previous diet.                           - Await pathology results.                           - Repeat colonoscopy in 5 years for surveillance. Procedure Code(s):        --- Professional ---                            [phone], Colonoscopy, flexible; with removal of                            tumor(s), polyp(s), or other lesion(s) by snare                            technique Diagnosis Code(s):        --- Professional ---                           S93.734, Personal history of other malignant                            neoplasm of large intestine                           K64.8, Other hemorrhoids                           K63.5, Polyp of colon                           Z98.0, Intestinal bypass and anastomosis status                           K57.30, Diverticulosis of large intestine without                            perforation or abscess without bleeding CPT copyright 2019 American Medical Association. All rights reserved. The codes documented in this report are preliminary and upon coder review may  be revised to meet current compliance requirements. Maylon Peppers, MD Maylon Peppers,  02/03/2022 8:21:55 AM This report has been signed electronically. Number of Addenda: 0

## 2022-02-03 NOTE — Anesthesia Preprocedure Evaluation (Signed)
Anesthesia Evaluation  Patient identified by MRN, date of birth, ID band Patient awake    Reviewed: Allergy & Precautions, NPO status , Patient's Chart, lab work & pertinent test results  Airway Mallampati: III  TM Distance: >3 FB Neck ROM: Full    Dental  (+) Dental Advisory Given, Teeth Intact   Pulmonary neg pulmonary ROS,    Pulmonary exam normal breath sounds clear to auscultation       Cardiovascular hypertension, Pt. on medications Normal cardiovascular exam Rhythm:Regular Rate:Normal     Neuro/Psych Seizures -, Well Controlled,  negative psych ROS   GI/Hepatic Neg liver ROS, hiatal hernia, Bowel prep,GERD  Medicated and Controlled,  Endo/Other  diabetes, Well Controlled, Type 2, Oral Hypoglycemic Agents  Renal/GU negative Renal ROS  negative genitourinary   Musculoskeletal negative musculoskeletal ROS (+)   Abdominal   Peds negative pediatric ROS (+)  Hematology negative hematology ROS (+)   Anesthesia Other Findings   Reproductive/Obstetrics negative OB ROS                             Anesthesia Physical Anesthesia Plan  ASA: 2  Anesthesia Plan: General   Post-op Pain Management: Minimal or no pain anticipated   Induction: Intravenous  PONV Risk Score and Plan: Propofol infusion  Airway Management Planned: Nasal Cannula and Natural Airway  Additional Equipment:   Intra-op Plan:   Post-operative Plan:   Informed Consent: I have reviewed the patients History and Physical, chart, labs and discussed the procedure including the risks, benefits and alternatives for the proposed anesthesia with the patient or authorized representative who has indicated his/her understanding and acceptance.     Dental advisory given  Plan Discussed with: CRNA and Surgeon  Anesthesia Plan Comments:         Anesthesia Quick Evaluation

## 2022-02-03 NOTE — H&P (Signed)
Sheri [name] is an 71 y.o. female.   Chief Complaint: history of colon cancer HPI: 71 y/o F with past medical history Hypertension, seizuresof colon cancer status postresection, diabetes, GERD, coming for Follow-up of colon cancer.  last colonoscopy was performed in 2015, no polyps were present.  The patient denies having any complaints such as melena, hematochezia, abdominal pain or distention, change in her bowel movement consistency or frequency, no changes in her weight recently.  No family history of colorectal cancer.  Past Medical History:  Diagnosis Date   Cancer [facility]) 2005   Colon Cancer   Diabetes mellitus without complication (HCC)    GERD (gastroesophageal reflux disease)    History of hiatal hernia    Hypertension    Seizures ([place])    As an infant    Past Surgical History:  Procedure Laterality Date   APPENDECTOMY     BREAST BIOPSY     COLONOSCOPY N/A 06/28/2014   Procedure: COLONOSCOPY;  Surgeon: Rogene Houston, MD;  Location: [facility];  Service: Endoscopy;  Laterality: N/A;  200 - moved to 9:30 - Ann to notify pt   LAPAROSCOPIC BILATERAL SALPINGO OOPHERECTOMY Bilateral    PARTIAL COLECTOMY  2005    Family History  Problem Relation Age of Onset   Heart attack Father    Social History:  reports that she has never smoked. She has never used smokeless tobacco. She reports that she does not drink alcohol and does not use drugs.  Allergies:  Allergies  Allergen Reactions   Ketoconazole Itching   Phenobarbital Hives    Medications Prior to Admission  Medication Sig Dispense Refill   aspirin EC 81 MG tablet Take 81 mg by mouth daily.     CALCIUM-VITAMIN D PO Take 600 mg by mouth daily.     dicyclomine (BENTYL) 10 MG capsule Take 10 mg by mouth 2 (two) times daily.     glipiZIDE (GLUCOTROL XL) 2.5 MG 24 hr tablet Take 2.5 mg by mouth daily with breakfast.     losartan-hydrochlorothiazide (HYZAAR) 50-12.5 MG per tablet Take 1 tablet by mouth daily.      metFORMIN (GLUCOPHAGE-XR) 500 MG 24 hr tablet Take 1,000 mg by mouth 2 (two) times daily.     Multiple Vitamins-Minerals (MULTIVITAMINS THER. W/MINERALS) TABS tablet Take 1 tablet by mouth daily. Centrum  Silver     pantoprazole (PROTONIX) 40 MG tablet Take 40 mg by mouth daily.     simvastatin (ZOCOR) 10 MG tablet Take 10 mg by mouth at bedtime.      Na Sulfate-K Sulfate-Mg Sulf (SUPREP BOWEL PREP KIT) 17.5-3.13-1.6 GM/177ML SOLN Take 1 kit by mouth as directed. 354 mL 0    Results for orders placed or performed during the hospital encounter of 02/03/22 (from the past 48 hour(s))  Glucose, capillary     Status: Abnormal   Collection Time: 02/03/22  6:53 AM  Result Value Ref Range   Glucose-Capillary 220 (H) 70 - 99 mg/dL    Comment: Glucose reference range applies only to samples taken after fasting for at least 8 hours.   No results found.  Review of Systems  Constitutional: Negative.   HENT: Negative.    Eyes: Negative.   Respiratory: Negative.    Cardiovascular: Negative.   Gastrointestinal: Negative.   Endocrine: Negative.   Genitourinary: Negative.   Musculoskeletal: Negative.   Skin: Negative.   Allergic/Immunologic: Negative.   Neurological: Negative.   Hematological: Negative.   Psychiatric/Behavioral: Negative.  Blood pressure (!) 156/72, pulse 85, temperature 97.8 F (36.6 C), resp. rate 13, SpO2 97 %. Physical Exam  GENERAL: The patient is AO x3, in no acute distress. HEENT: Head is normocephalic and atraumatic. EOMI are intact. Mouth is well hydrated and without lesions. NECK: Supple. No masses LUNGS: Clear to auscultation. No presence of rhonchi/wheezing/rales. Adequate chest expansion HEART: RRR, normal s1 and s2. ABDOMEN: Soft, nontender, no guarding, no peritoneal signs, and nondistended. BS +. No masses. EXTREMITIES: Without any cyanosis, clubbing, rash, lesions or edema. NEUROLOGIC: AOx3, no focal motor deficit. SKIN: no jaundice, no  rashes  Assessment/Plan  71 y/o F with past medical history Hypertension, seizuresof colon cancer status postresection, diabetes, GERD, coming for Follow-up of colon cancer.  We will proceed with colonoscopy.   Harvel Quale, MD 02/03/2022, 7:29 AM

## 2022-02-03 NOTE — Anesthesia Postprocedure Evaluation (Signed)
Anesthesia Post Note  Patient: Sheri [name]  Procedure(s) Performed: COLONOSCOPY WITH PROPOFOL POLYPECTOMY  Patient location during evaluation: Phase II Anesthesia Type: General Level of consciousness: awake and alert and oriented Pain management: pain level controlled Vital Signs Assessment: post-procedure vital signs reviewed and stable Respiratory status: spontaneous breathing, nonlabored ventilation and respiratory function stable Cardiovascular status: blood pressure returned to baseline and stable Postop Assessment: no apparent nausea or vomiting Anesthetic complications: no   No notable events documented.   Last Vitals:  Vitals:   02/03/22 0700 02/03/22 0821  BP: (!) 156/72 101/75  Pulse: 85 94  Resp: 13 18  Temp: 36.6 C 36.5 C  SpO2: 97% 97%    Last Pain:  Vitals:   02/03/22 0821  TempSrc: Oral  PainSc: 0-No pain                 Joanann Mies C Braylinn Gulden

## 2022-02-03 NOTE — Discharge Instructions (Signed)
You are being discharged to home.  Resume your previous diet.  We are waiting for your pathology results.  Your physician has recommended a repeat colonoscopy in five years for surveillance.  

## 2022-02-04 LAB — SURGICAL PATHOLOGY

## 2022-02-09 DIAGNOSIS — L821 Other seborrheic keratosis: Secondary | ICD-10-CM | POA: Diagnosis not present

## 2022-02-09 DIAGNOSIS — L814 Other melanin hyperpigmentation: Secondary | ICD-10-CM | POA: Diagnosis not present

## 2022-02-09 DIAGNOSIS — L918 Other hypertrophic disorders of the skin: Secondary | ICD-10-CM | POA: Diagnosis not present

## 2022-02-09 DIAGNOSIS — Z1283 Encounter for screening for malignant neoplasm of skin: Secondary | ICD-10-CM | POA: Diagnosis not present

## 2022-02-09 DIAGNOSIS — D23112 Other benign neoplasm of skin of right lower eyelid, including canthus: Secondary | ICD-10-CM | POA: Diagnosis not present

## 2022-02-10 ENCOUNTER — Encounter (HOSPITAL_COMMUNITY): Payer: Self-pay | Admitting: Gastroenterology

## 2022-02-25 DIAGNOSIS — H40023 Open angle with borderline findings, high risk, bilateral: Secondary | ICD-10-CM | POA: Diagnosis not present

## 2022-02-26 DIAGNOSIS — I10 Essential (primary) hypertension: Secondary | ICD-10-CM | POA: Diagnosis not present

## 2022-02-26 DIAGNOSIS — K2100 Gastro-esophageal reflux disease with esophagitis, without bleeding: Secondary | ICD-10-CM | POA: Diagnosis not present

## 2022-02-26 DIAGNOSIS — E7849 Other hyperlipidemia: Secondary | ICD-10-CM | POA: Diagnosis not present

## 2022-02-26 DIAGNOSIS — N183 Chronic kidney disease, stage 3 unspecified: Secondary | ICD-10-CM | POA: Diagnosis not present

## 2022-02-26 DIAGNOSIS — E1165 Type 2 diabetes mellitus with hyperglycemia: Secondary | ICD-10-CM | POA: Diagnosis not present

## 2022-03-05 DIAGNOSIS — M79672 Pain in left foot: Secondary | ICD-10-CM | POA: Diagnosis not present

## 2022-03-05 DIAGNOSIS — E1165 Type 2 diabetes mellitus with hyperglycemia: Secondary | ICD-10-CM | POA: Diagnosis not present

## 2022-03-05 DIAGNOSIS — M25561 Pain in right knee: Secondary | ICD-10-CM | POA: Diagnosis not present

## 2022-03-05 DIAGNOSIS — E7849 Other hyperlipidemia: Secondary | ICD-10-CM | POA: Diagnosis not present

## 2022-03-05 DIAGNOSIS — Z0001 Encounter for general adult medical examination with abnormal findings: Secondary | ICD-10-CM | POA: Diagnosis not present

## 2022-03-05 DIAGNOSIS — Z23 Encounter for immunization: Secondary | ICD-10-CM | POA: Diagnosis not present

## 2022-03-05 DIAGNOSIS — M1712 Unilateral primary osteoarthritis, left knee: Secondary | ICD-10-CM | POA: Diagnosis not present

## 2022-03-05 DIAGNOSIS — E162 Hypoglycemia, unspecified: Secondary | ICD-10-CM | POA: Diagnosis not present

## 2022-03-05 DIAGNOSIS — I10 Essential (primary) hypertension: Secondary | ICD-10-CM | POA: Diagnosis not present

## 2022-03-05 DIAGNOSIS — E1122 Type 2 diabetes mellitus with diabetic chronic kidney disease: Secondary | ICD-10-CM | POA: Diagnosis not present

## 2022-03-05 DIAGNOSIS — K580 Irritable bowel syndrome with diarrhea: Secondary | ICD-10-CM | POA: Diagnosis not present

## 2022-03-05 DIAGNOSIS — R059 Cough, unspecified: Secondary | ICD-10-CM | POA: Diagnosis not present

## 2022-06-24 DIAGNOSIS — E1122 Type 2 diabetes mellitus with diabetic chronic kidney disease: Secondary | ICD-10-CM | POA: Diagnosis not present

## 2022-06-24 DIAGNOSIS — K2100 Gastro-esophageal reflux disease with esophagitis, without bleeding: Secondary | ICD-10-CM | POA: Diagnosis not present

## 2022-06-24 DIAGNOSIS — E782 Mixed hyperlipidemia: Secondary | ICD-10-CM | POA: Diagnosis not present

## 2022-06-24 DIAGNOSIS — E7849 Other hyperlipidemia: Secondary | ICD-10-CM | POA: Diagnosis not present

## 2022-06-24 DIAGNOSIS — I10 Essential (primary) hypertension: Secondary | ICD-10-CM | POA: Diagnosis not present

## 2022-07-01 DIAGNOSIS — M25561 Pain in right knee: Secondary | ICD-10-CM | POA: Diagnosis not present

## 2022-07-01 DIAGNOSIS — E1165 Type 2 diabetes mellitus with hyperglycemia: Secondary | ICD-10-CM | POA: Diagnosis not present

## 2022-07-01 DIAGNOSIS — M79672 Pain in left foot: Secondary | ICD-10-CM | POA: Diagnosis not present

## 2022-07-01 DIAGNOSIS — K580 Irritable bowel syndrome with diarrhea: Secondary | ICD-10-CM | POA: Diagnosis not present

## 2022-07-01 DIAGNOSIS — Z23 Encounter for immunization: Secondary | ICD-10-CM | POA: Diagnosis not present

## 2022-07-01 DIAGNOSIS — N1832 Chronic kidney disease, stage 3b: Secondary | ICD-10-CM | POA: Diagnosis not present

## 2022-07-01 DIAGNOSIS — I10 Essential (primary) hypertension: Secondary | ICD-10-CM | POA: Diagnosis not present

## 2022-07-01 DIAGNOSIS — K2100 Gastro-esophageal reflux disease with esophagitis, without bleeding: Secondary | ICD-10-CM | POA: Diagnosis not present

## 2022-07-01 DIAGNOSIS — E1122 Type 2 diabetes mellitus with diabetic chronic kidney disease: Secondary | ICD-10-CM | POA: Diagnosis not present

## 2022-07-01 DIAGNOSIS — E7849 Other hyperlipidemia: Secondary | ICD-10-CM | POA: Diagnosis not present

## 2022-07-01 DIAGNOSIS — M1712 Unilateral primary osteoarthritis, left knee: Secondary | ICD-10-CM | POA: Diagnosis not present

## 2022-08-28 DIAGNOSIS — M25562 Pain in left knee: Secondary | ICD-10-CM | POA: Diagnosis not present

## 2022-10-15 DIAGNOSIS — E1122 Type 2 diabetes mellitus with diabetic chronic kidney disease: Secondary | ICD-10-CM | POA: Diagnosis not present

## 2022-10-15 DIAGNOSIS — E7849 Other hyperlipidemia: Secondary | ICD-10-CM | POA: Diagnosis not present

## 2022-10-15 DIAGNOSIS — K2100 Gastro-esophageal reflux disease with esophagitis, without bleeding: Secondary | ICD-10-CM | POA: Diagnosis not present

## 2022-10-15 DIAGNOSIS — N183 Chronic kidney disease, stage 3 unspecified: Secondary | ICD-10-CM | POA: Diagnosis not present

## 2022-10-19 DIAGNOSIS — E1122 Type 2 diabetes mellitus with diabetic chronic kidney disease: Secondary | ICD-10-CM | POA: Diagnosis not present

## 2022-10-19 DIAGNOSIS — I10 Essential (primary) hypertension: Secondary | ICD-10-CM | POA: Diagnosis not present

## 2022-10-19 DIAGNOSIS — M25561 Pain in right knee: Secondary | ICD-10-CM | POA: Diagnosis not present

## 2022-10-19 DIAGNOSIS — N1832 Chronic kidney disease, stage 3b: Secondary | ICD-10-CM | POA: Diagnosis not present

## 2022-10-19 DIAGNOSIS — E7849 Other hyperlipidemia: Secondary | ICD-10-CM | POA: Diagnosis not present

## 2022-10-19 DIAGNOSIS — K580 Irritable bowel syndrome with diarrhea: Secondary | ICD-10-CM | POA: Diagnosis not present

## 2022-10-19 DIAGNOSIS — E1165 Type 2 diabetes mellitus with hyperglycemia: Secondary | ICD-10-CM | POA: Diagnosis not present

## 2022-10-19 DIAGNOSIS — M1712 Unilateral primary osteoarthritis, left knee: Secondary | ICD-10-CM | POA: Diagnosis not present

## 2022-10-19 DIAGNOSIS — K2100 Gastro-esophageal reflux disease with esophagitis, without bleeding: Secondary | ICD-10-CM | POA: Diagnosis not present

## 2022-10-19 DIAGNOSIS — M79672 Pain in left foot: Secondary | ICD-10-CM | POA: Diagnosis not present

## 2022-12-23 DIAGNOSIS — H40023 Open angle with borderline findings, high risk, bilateral: Secondary | ICD-10-CM | POA: Diagnosis not present

## 2022-12-23 DIAGNOSIS — H43811 Vitreous degeneration, right eye: Secondary | ICD-10-CM | POA: Diagnosis not present

## 2023-02-11 DIAGNOSIS — E1122 Type 2 diabetes mellitus with diabetic chronic kidney disease: Secondary | ICD-10-CM | POA: Diagnosis not present

## 2023-02-11 DIAGNOSIS — N1832 Chronic kidney disease, stage 3b: Secondary | ICD-10-CM | POA: Diagnosis not present

## 2023-02-11 DIAGNOSIS — E7849 Other hyperlipidemia: Secondary | ICD-10-CM | POA: Diagnosis not present

## 2023-02-16 DIAGNOSIS — M25561 Pain in right knee: Secondary | ICD-10-CM | POA: Diagnosis not present

## 2023-02-16 DIAGNOSIS — I10 Essential (primary) hypertension: Secondary | ICD-10-CM | POA: Diagnosis not present

## 2023-02-16 DIAGNOSIS — E1165 Type 2 diabetes mellitus with hyperglycemia: Secondary | ICD-10-CM | POA: Diagnosis not present

## 2023-02-16 DIAGNOSIS — M1712 Unilateral primary osteoarthritis, left knee: Secondary | ICD-10-CM | POA: Diagnosis not present

## 2023-02-16 DIAGNOSIS — M79672 Pain in left foot: Secondary | ICD-10-CM | POA: Diagnosis not present

## 2023-02-16 DIAGNOSIS — K580 Irritable bowel syndrome with diarrhea: Secondary | ICD-10-CM | POA: Diagnosis not present

## 2023-02-16 DIAGNOSIS — E7849 Other hyperlipidemia: Secondary | ICD-10-CM | POA: Diagnosis not present

## 2023-02-16 DIAGNOSIS — E1122 Type 2 diabetes mellitus with diabetic chronic kidney disease: Secondary | ICD-10-CM | POA: Diagnosis not present

## 2023-02-16 DIAGNOSIS — N1832 Chronic kidney disease, stage 3b: Secondary | ICD-10-CM | POA: Diagnosis not present

## 2023-02-16 DIAGNOSIS — K2100 Gastro-esophageal reflux disease with esophagitis, without bleeding: Secondary | ICD-10-CM | POA: Diagnosis not present

## 2023-02-17 DIAGNOSIS — D2239 Melanocytic nevi of other parts of face: Secondary | ICD-10-CM | POA: Diagnosis not present

## 2023-02-17 DIAGNOSIS — D225 Melanocytic nevi of trunk: Secondary | ICD-10-CM | POA: Diagnosis not present

## 2023-02-17 DIAGNOSIS — D224 Melanocytic nevi of scalp and neck: Secondary | ICD-10-CM | POA: Diagnosis not present

## 2023-02-17 DIAGNOSIS — L2089 Other atopic dermatitis: Secondary | ICD-10-CM | POA: Diagnosis not present

## 2023-02-17 DIAGNOSIS — L821 Other seborrheic keratosis: Secondary | ICD-10-CM | POA: Diagnosis not present

## 2023-02-17 DIAGNOSIS — L918 Other hypertrophic disorders of the skin: Secondary | ICD-10-CM | POA: Diagnosis not present

## 2023-02-17 DIAGNOSIS — L658 Other specified nonscarring hair loss: Secondary | ICD-10-CM | POA: Diagnosis not present

## 2023-03-01 DIAGNOSIS — H40023 Open angle with borderline findings, high risk, bilateral: Secondary | ICD-10-CM | POA: Diagnosis not present

## 2023-03-19 DIAGNOSIS — H26493 Other secondary cataract, bilateral: Secondary | ICD-10-CM | POA: Diagnosis not present

## 2023-04-05 IMAGING — NM NM BONE WHOLE BODY
2 series · 2 of 2 positions shown · non-contrast
Comparison: None.

CLINICAL DATA: Colon cancer, left shoulder pain

EXAM:
NUCLEAR MEDICINE WHOLE BODY BONE SCAN
TECHNIQUE: Whole body anterior and posterior images were obtained approximately
3 hours after intravenous injection of radiopharmaceutical.
RADIOPHARMACEUTICALS:  19.8 mCi 3echnetium-MMm MDP IV

[Series 1: whole body · 2.66mm/px · 1 of 1 slices shown (1 of 2)]
[im 1/1]
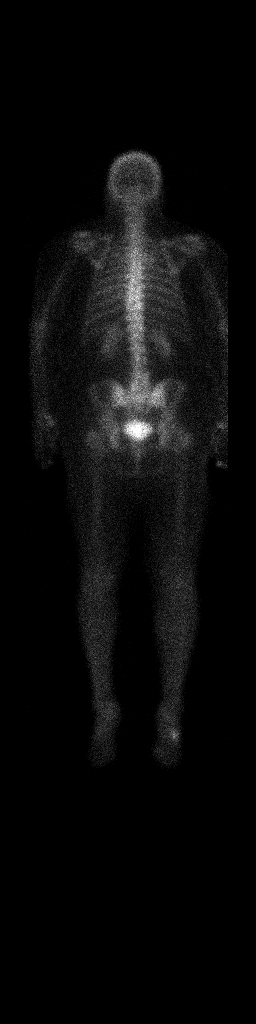

[Series 1: whole body · 2.66mm/px · 1 of 1 slices shown (2 of 2)]
[im 1/1]
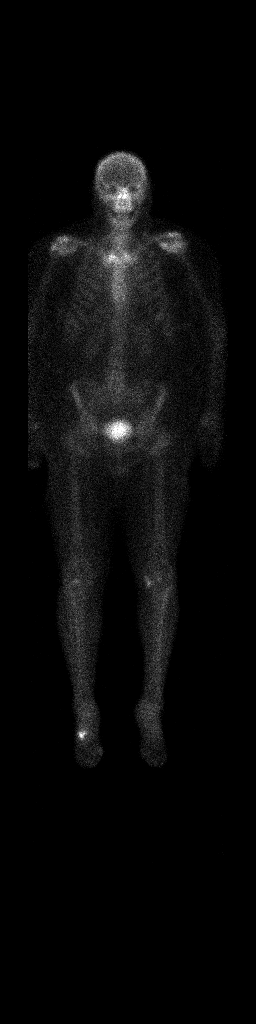

[2 of 2 positions shown; findings below may reference images not displayed]

FINDINGS: Mild, degenerative radiotracer uptake of the bilateral shoulders,
sternoclavicular joints, knees, and lateral right foot. No focally
suspicious osseous uptake.

Expected soft tissue uptake and urinary tract uptake and excretion.
IMPRESSION: 1.  No suspicious osseous radiotracer uptake.

2.  Mild, degenerative radiotracer uptake as described above.

## 2023-04-09 DIAGNOSIS — H26491 Other secondary cataract, right eye: Secondary | ICD-10-CM | POA: Diagnosis not present

## 2023-06-14 DIAGNOSIS — E7849 Other hyperlipidemia: Secondary | ICD-10-CM | POA: Diagnosis not present

## 2023-06-14 DIAGNOSIS — K2100 Gastro-esophageal reflux disease with esophagitis, without bleeding: Secondary | ICD-10-CM | POA: Diagnosis not present

## 2023-06-14 DIAGNOSIS — N183 Chronic kidney disease, stage 3 unspecified: Secondary | ICD-10-CM | POA: Diagnosis not present

## 2023-06-14 DIAGNOSIS — E1122 Type 2 diabetes mellitus with diabetic chronic kidney disease: Secondary | ICD-10-CM | POA: Diagnosis not present

## 2023-06-21 DIAGNOSIS — E1122 Type 2 diabetes mellitus with diabetic chronic kidney disease: Secondary | ICD-10-CM | POA: Diagnosis not present

## 2023-06-21 DIAGNOSIS — Z23 Encounter for immunization: Secondary | ICD-10-CM | POA: Diagnosis not present

## 2023-06-21 DIAGNOSIS — K2100 Gastro-esophageal reflux disease with esophagitis, without bleeding: Secondary | ICD-10-CM | POA: Diagnosis not present

## 2023-06-21 DIAGNOSIS — M79672 Pain in left foot: Secondary | ICD-10-CM | POA: Diagnosis not present

## 2023-06-21 DIAGNOSIS — M1712 Unilateral primary osteoarthritis, left knee: Secondary | ICD-10-CM | POA: Diagnosis not present

## 2023-06-21 DIAGNOSIS — E1165 Type 2 diabetes mellitus with hyperglycemia: Secondary | ICD-10-CM | POA: Diagnosis not present

## 2023-06-21 DIAGNOSIS — K580 Irritable bowel syndrome with diarrhea: Secondary | ICD-10-CM | POA: Diagnosis not present

## 2023-06-21 DIAGNOSIS — I10 Essential (primary) hypertension: Secondary | ICD-10-CM | POA: Diagnosis not present

## 2023-06-21 DIAGNOSIS — M25561 Pain in right knee: Secondary | ICD-10-CM | POA: Diagnosis not present

## 2023-06-21 DIAGNOSIS — E7849 Other hyperlipidemia: Secondary | ICD-10-CM | POA: Diagnosis not present

## 2023-06-23 DIAGNOSIS — N183 Chronic kidney disease, stage 3 unspecified: Secondary | ICD-10-CM | POA: Diagnosis not present

## 2023-06-23 DIAGNOSIS — E1122 Type 2 diabetes mellitus with diabetic chronic kidney disease: Secondary | ICD-10-CM | POA: Diagnosis not present

## 2023-06-23 DIAGNOSIS — I10 Essential (primary) hypertension: Secondary | ICD-10-CM | POA: Diagnosis not present

## 2023-06-23 DIAGNOSIS — E7849 Other hyperlipidemia: Secondary | ICD-10-CM | POA: Diagnosis not present

## 2023-06-23 DIAGNOSIS — Z0001 Encounter for general adult medical examination with abnormal findings: Secondary | ICD-10-CM | POA: Diagnosis not present

## 2023-06-23 DIAGNOSIS — F17210 Nicotine dependence, cigarettes, uncomplicated: Secondary | ICD-10-CM | POA: Diagnosis not present

## 2023-06-25 DIAGNOSIS — M25512 Pain in left shoulder: Secondary | ICD-10-CM | POA: Diagnosis not present

## 2023-10-26 DIAGNOSIS — N1832 Chronic kidney disease, stage 3b: Secondary | ICD-10-CM | POA: Diagnosis not present

## 2023-10-26 DIAGNOSIS — E1122 Type 2 diabetes mellitus with diabetic chronic kidney disease: Secondary | ICD-10-CM | POA: Diagnosis not present

## 2023-10-26 DIAGNOSIS — E7849 Other hyperlipidemia: Secondary | ICD-10-CM | POA: Diagnosis not present

## 2023-10-26 DIAGNOSIS — K2100 Gastro-esophageal reflux disease with esophagitis, without bleeding: Secondary | ICD-10-CM | POA: Diagnosis not present

## 2023-11-01 DIAGNOSIS — E1122 Type 2 diabetes mellitus with diabetic chronic kidney disease: Secondary | ICD-10-CM | POA: Diagnosis not present

## 2023-11-01 DIAGNOSIS — N1832 Chronic kidney disease, stage 3b: Secondary | ICD-10-CM | POA: Diagnosis not present

## 2023-11-01 DIAGNOSIS — E782 Mixed hyperlipidemia: Secondary | ICD-10-CM | POA: Diagnosis not present

## 2023-11-01 DIAGNOSIS — K2100 Gastro-esophageal reflux disease with esophagitis, without bleeding: Secondary | ICD-10-CM | POA: Diagnosis not present

## 2023-11-04 DIAGNOSIS — B370 Candidal stomatitis: Secondary | ICD-10-CM | POA: Diagnosis not present

## 2023-11-25 DIAGNOSIS — K1379 Other lesions of oral mucosa: Secondary | ICD-10-CM | POA: Diagnosis not present

## 2023-11-25 DIAGNOSIS — Z1389 Encounter for screening for other disorder: Secondary | ICD-10-CM | POA: Diagnosis not present

## 2024-01-05 DIAGNOSIS — M25511 Pain in right shoulder: Secondary | ICD-10-CM | POA: Diagnosis not present

## 2024-01-26 DIAGNOSIS — M25511 Pain in right shoulder: Secondary | ICD-10-CM | POA: Diagnosis not present

## 2024-02-02 DIAGNOSIS — M7581 Other shoulder lesions, right shoulder: Secondary | ICD-10-CM | POA: Diagnosis not present

## 2024-02-02 DIAGNOSIS — M19011 Primary osteoarthritis, right shoulder: Secondary | ICD-10-CM | POA: Diagnosis not present

## 2024-02-02 DIAGNOSIS — S43431A Superior glenoid labrum lesion of right shoulder, initial encounter: Secondary | ICD-10-CM | POA: Diagnosis not present

## 2024-02-02 DIAGNOSIS — M75121 Complete rotator cuff tear or rupture of right shoulder, not specified as traumatic: Secondary | ICD-10-CM | POA: Diagnosis not present

## 2024-02-02 DIAGNOSIS — S46011A Strain of muscle(s) and tendon(s) of the rotator cuff of right shoulder, initial encounter: Secondary | ICD-10-CM | POA: Diagnosis not present

## 2024-02-02 DIAGNOSIS — M25411 Effusion, right shoulder: Secondary | ICD-10-CM | POA: Diagnosis not present

## 2024-02-09 DIAGNOSIS — M25511 Pain in right shoulder: Secondary | ICD-10-CM | POA: Diagnosis not present

## 2024-02-21 DIAGNOSIS — M75101 Unspecified rotator cuff tear or rupture of right shoulder, not specified as traumatic: Secondary | ICD-10-CM | POA: Diagnosis not present

## 2024-02-22 DIAGNOSIS — L814 Other melanin hyperpigmentation: Secondary | ICD-10-CM | POA: Diagnosis not present

## 2024-02-22 DIAGNOSIS — L821 Other seborrheic keratosis: Secondary | ICD-10-CM | POA: Diagnosis not present

## 2024-02-22 DIAGNOSIS — L570 Actinic keratosis: Secondary | ICD-10-CM | POA: Diagnosis not present

## 2024-02-22 DIAGNOSIS — L918 Other hypertrophic disorders of the skin: Secondary | ICD-10-CM | POA: Diagnosis not present

## 2024-02-22 DIAGNOSIS — D234 Other benign neoplasm of skin of scalp and neck: Secondary | ICD-10-CM | POA: Diagnosis not present

## 2024-02-22 DIAGNOSIS — D2239 Melanocytic nevi of other parts of face: Secondary | ICD-10-CM | POA: Diagnosis not present

## 2024-02-22 DIAGNOSIS — Z1283 Encounter for screening for malignant neoplasm of skin: Secondary | ICD-10-CM | POA: Diagnosis not present

## 2024-02-22 DIAGNOSIS — D485 Neoplasm of uncertain behavior of skin: Secondary | ICD-10-CM | POA: Diagnosis not present

## 2024-02-25 DIAGNOSIS — M75101 Unspecified rotator cuff tear or rupture of right shoulder, not specified as traumatic: Secondary | ICD-10-CM | POA: Diagnosis not present

## 2024-02-28 DIAGNOSIS — E7849 Other hyperlipidemia: Secondary | ICD-10-CM | POA: Diagnosis not present

## 2024-02-28 DIAGNOSIS — I10 Essential (primary) hypertension: Secondary | ICD-10-CM | POA: Diagnosis not present

## 2024-02-28 DIAGNOSIS — N1832 Chronic kidney disease, stage 3b: Secondary | ICD-10-CM | POA: Diagnosis not present

## 2024-02-28 DIAGNOSIS — E1122 Type 2 diabetes mellitus with diabetic chronic kidney disease: Secondary | ICD-10-CM | POA: Diagnosis not present

## 2024-02-29 DIAGNOSIS — M75101 Unspecified rotator cuff tear or rupture of right shoulder, not specified as traumatic: Secondary | ICD-10-CM | POA: Diagnosis not present

## 2024-03-02 DIAGNOSIS — M75101 Unspecified rotator cuff tear or rupture of right shoulder, not specified as traumatic: Secondary | ICD-10-CM | POA: Diagnosis not present

## 2024-03-03 DIAGNOSIS — R197 Diarrhea, unspecified: Secondary | ICD-10-CM | POA: Diagnosis not present

## 2024-03-06 DIAGNOSIS — E782 Mixed hyperlipidemia: Secondary | ICD-10-CM | POA: Diagnosis not present

## 2024-03-06 DIAGNOSIS — I10 Essential (primary) hypertension: Secondary | ICD-10-CM | POA: Diagnosis not present

## 2024-03-06 DIAGNOSIS — K2100 Gastro-esophageal reflux disease with esophagitis, without bleeding: Secondary | ICD-10-CM | POA: Diagnosis not present

## 2024-03-06 DIAGNOSIS — N1832 Chronic kidney disease, stage 3b: Secondary | ICD-10-CM | POA: Diagnosis not present

## 2024-03-07 DIAGNOSIS — M75101 Unspecified rotator cuff tear or rupture of right shoulder, not specified as traumatic: Secondary | ICD-10-CM | POA: Diagnosis not present

## 2024-03-09 DIAGNOSIS — M75101 Unspecified rotator cuff tear or rupture of right shoulder, not specified as traumatic: Secondary | ICD-10-CM | POA: Diagnosis not present

## 2024-03-24 DIAGNOSIS — M75101 Unspecified rotator cuff tear or rupture of right shoulder, not specified as traumatic: Secondary | ICD-10-CM | POA: Diagnosis not present

## 2024-03-27 DIAGNOSIS — M75101 Unspecified rotator cuff tear or rupture of right shoulder, not specified as traumatic: Secondary | ICD-10-CM | POA: Diagnosis not present

## 2024-03-30 DIAGNOSIS — M75101 Unspecified rotator cuff tear or rupture of right shoulder, not specified as traumatic: Secondary | ICD-10-CM | POA: Diagnosis not present

## 2024-04-07 DIAGNOSIS — M75101 Unspecified rotator cuff tear or rupture of right shoulder, not specified as traumatic: Secondary | ICD-10-CM | POA: Diagnosis not present

## 2024-04-10 DIAGNOSIS — M75101 Unspecified rotator cuff tear or rupture of right shoulder, not specified as traumatic: Secondary | ICD-10-CM | POA: Diagnosis not present

## 2024-04-14 DIAGNOSIS — M75101 Unspecified rotator cuff tear or rupture of right shoulder, not specified as traumatic: Secondary | ICD-10-CM | POA: Diagnosis not present

## 2024-04-20 DIAGNOSIS — M75101 Unspecified rotator cuff tear or rupture of right shoulder, not specified as traumatic: Secondary | ICD-10-CM | POA: Diagnosis not present

## 2024-04-21 DIAGNOSIS — M75101 Unspecified rotator cuff tear or rupture of right shoulder, not specified as traumatic: Secondary | ICD-10-CM | POA: Diagnosis not present

## 2024-04-24 DIAGNOSIS — M75101 Unspecified rotator cuff tear or rupture of right shoulder, not specified as traumatic: Secondary | ICD-10-CM | POA: Diagnosis not present

## 2024-04-27 DIAGNOSIS — M75101 Unspecified rotator cuff tear or rupture of right shoulder, not specified as traumatic: Secondary | ICD-10-CM | POA: Diagnosis not present

## 2024-04-28 DIAGNOSIS — H40023 Open angle with borderline findings, high risk, bilateral: Secondary | ICD-10-CM | POA: Diagnosis not present

## 2024-05-09 DIAGNOSIS — M75101 Unspecified rotator cuff tear or rupture of right shoulder, not specified as traumatic: Secondary | ICD-10-CM | POA: Diagnosis not present

## 2024-05-17 DIAGNOSIS — H6121 Impacted cerumen, right ear: Secondary | ICD-10-CM | POA: Diagnosis not present

## 2024-05-17 DIAGNOSIS — M545 Low back pain, unspecified: Secondary | ICD-10-CM | POA: Diagnosis not present

## 2024-05-17 DIAGNOSIS — H6122 Impacted cerumen, left ear: Secondary | ICD-10-CM | POA: Diagnosis not present

## 2024-05-17 DIAGNOSIS — H6123 Impacted cerumen, bilateral: Secondary | ICD-10-CM | POA: Diagnosis not present

## 2024-05-30 DIAGNOSIS — M545 Low back pain, unspecified: Secondary | ICD-10-CM | POA: Diagnosis not present

## 2024-06-05 DIAGNOSIS — M546 Pain in thoracic spine: Secondary | ICD-10-CM | POA: Diagnosis not present

## 2024-06-05 DIAGNOSIS — K5900 Constipation, unspecified: Secondary | ICD-10-CM | POA: Diagnosis not present

## 2024-06-05 DIAGNOSIS — R030 Elevated blood-pressure reading, without diagnosis of hypertension: Secondary | ICD-10-CM | POA: Diagnosis not present
